# Patient Record
Sex: Female | Born: 1970 | Race: White | Hispanic: No | Marital: Married | Smoking: Never smoker
Health system: Southern US, Community
[De-identification: ages and names within clinical notes are randomized; demographics above are authoritative.]

## PROBLEM LIST (undated history)

## (undated) DIAGNOSIS — F32A Depression, unspecified: Secondary | ICD-10-CM

## (undated) DIAGNOSIS — J189 Pneumonia, unspecified organism: Secondary | ICD-10-CM

## (undated) DIAGNOSIS — F329 Major depressive disorder, single episode, unspecified: Secondary | ICD-10-CM

## (undated) DIAGNOSIS — I10 Essential (primary) hypertension: Secondary | ICD-10-CM

---

## 1999-10-12 ENCOUNTER — Other Ambulatory Visit: Admission: RE | Admit: 1999-10-12 | Discharge: 1999-10-12 | Payer: Self-pay | Admitting: *Deleted

## 2000-06-12 ENCOUNTER — Emergency Department (HOSPITAL_COMMUNITY): Admission: EM | Admit: 2000-06-12 | Discharge: 2000-06-12 | Payer: Self-pay | Admitting: Emergency Medicine

## 2001-01-19 ENCOUNTER — Other Ambulatory Visit: Admission: RE | Admit: 2001-01-19 | Discharge: 2001-01-19 | Payer: Self-pay | Admitting: *Deleted

## 2002-01-16 ENCOUNTER — Emergency Department (HOSPITAL_COMMUNITY): Admission: EM | Admit: 2002-01-16 | Discharge: 2002-01-17 | Payer: Self-pay | Admitting: Emergency Medicine

## 2002-01-17 ENCOUNTER — Encounter: Payer: Self-pay | Admitting: Emergency Medicine

## 2002-03-12 ENCOUNTER — Other Ambulatory Visit: Admission: RE | Admit: 2002-03-12 | Discharge: 2002-03-12 | Payer: Self-pay | Admitting: Obstetrics and Gynecology

## 2003-03-15 ENCOUNTER — Other Ambulatory Visit: Admission: RE | Admit: 2003-03-15 | Discharge: 2003-03-15 | Payer: Self-pay | Admitting: Obstetrics and Gynecology

## 2003-06-17 ENCOUNTER — Encounter: Payer: Self-pay | Admitting: Internal Medicine

## 2003-06-17 ENCOUNTER — Encounter: Admission: RE | Admit: 2003-06-17 | Discharge: 2003-06-17 | Payer: Self-pay | Admitting: Internal Medicine

## 2004-12-25 ENCOUNTER — Encounter: Admission: RE | Admit: 2004-12-25 | Discharge: 2004-12-25 | Payer: Self-pay | Admitting: Internal Medicine

## 2007-02-17 ENCOUNTER — Encounter: Admission: RE | Admit: 2007-02-17 | Discharge: 2007-02-17 | Payer: Self-pay | Admitting: Internal Medicine

## 2008-06-16 ENCOUNTER — Encounter: Admission: RE | Admit: 2008-06-16 | Discharge: 2008-06-16 | Payer: Self-pay | Admitting: Internal Medicine

## 2008-07-18 ENCOUNTER — Encounter: Admission: RE | Admit: 2008-07-18 | Discharge: 2008-07-18 | Payer: Self-pay | Admitting: Internal Medicine

## 2008-12-12 ENCOUNTER — Encounter: Admission: RE | Admit: 2008-12-12 | Discharge: 2008-12-12 | Payer: Self-pay | Admitting: Internal Medicine

## 2008-12-27 ENCOUNTER — Encounter: Admission: RE | Admit: 2008-12-27 | Discharge: 2008-12-27 | Payer: Self-pay | Admitting: Internal Medicine

## 2009-09-23 DIAGNOSIS — J189 Pneumonia, unspecified organism: Secondary | ICD-10-CM

## 2009-09-23 HISTORY — DX: Pneumonia, unspecified organism: J18.9

## 2010-05-16 ENCOUNTER — Encounter: Admission: RE | Admit: 2010-05-16 | Discharge: 2010-05-16 | Payer: Self-pay | Admitting: Internal Medicine

## 2010-08-21 ENCOUNTER — Encounter: Admission: RE | Admit: 2010-08-21 | Discharge: 2010-08-21 | Payer: Self-pay | Admitting: Interventional Radiology

## 2010-09-25 ENCOUNTER — Encounter
Admission: RE | Admit: 2010-09-25 | Discharge: 2010-09-25 | Payer: Self-pay | Source: Home / Self Care | Attending: Interventional Radiology | Admitting: Interventional Radiology

## 2010-10-02 ENCOUNTER — Encounter
Admission: RE | Admit: 2010-10-02 | Discharge: 2010-10-02 | Payer: Self-pay | Source: Home / Self Care | Attending: Interventional Radiology | Admitting: Interventional Radiology

## 2010-10-13 ENCOUNTER — Encounter: Payer: Self-pay | Admitting: Internal Medicine

## 2010-10-13 ENCOUNTER — Other Ambulatory Visit: Payer: Self-pay | Admitting: Interventional Radiology

## 2010-10-13 DIAGNOSIS — I8393 Asymptomatic varicose veins of bilateral lower extremities: Secondary | ICD-10-CM

## 2010-10-13 DIAGNOSIS — I8390 Asymptomatic varicose veins of unspecified lower extremity: Secondary | ICD-10-CM

## 2010-10-14 ENCOUNTER — Encounter: Payer: Self-pay | Admitting: Emergency Medicine

## 2010-10-14 ENCOUNTER — Encounter: Payer: Self-pay | Admitting: Internal Medicine

## 2010-10-15 ENCOUNTER — Other Ambulatory Visit: Payer: Self-pay | Admitting: Interventional Radiology

## 2010-10-15 DIAGNOSIS — I8390 Asymptomatic varicose veins of unspecified lower extremity: Secondary | ICD-10-CM

## 2010-10-24 ENCOUNTER — Other Ambulatory Visit: Payer: Self-pay | Admitting: Interventional Radiology

## 2010-10-24 ENCOUNTER — Ambulatory Visit: Payer: Self-pay

## 2010-10-24 ENCOUNTER — Ambulatory Visit
Admission: RE | Admit: 2010-10-24 | Discharge: 2010-10-24 | Disposition: A | Discharge status: Home / Self Care | Payer: Self-pay | Source: Ambulatory Visit | Attending: Interventional Radiology | Admitting: Interventional Radiology

## 2010-10-24 DIAGNOSIS — I8390 Asymptomatic varicose veins of unspecified lower extremity: Secondary | ICD-10-CM

## 2010-10-24 DIAGNOSIS — I8393 Asymptomatic varicose veins of bilateral lower extremities: Secondary | ICD-10-CM

## 2010-11-02 ENCOUNTER — Other Ambulatory Visit: Payer: Self-pay | Admitting: Interventional Radiology

## 2010-11-02 DIAGNOSIS — I8393 Asymptomatic varicose veins of bilateral lower extremities: Secondary | ICD-10-CM

## 2010-11-07 ENCOUNTER — Other Ambulatory Visit: Payer: Self-pay

## 2010-11-07 ENCOUNTER — Ambulatory Visit: Payer: Self-pay

## 2010-11-22 ENCOUNTER — Other Ambulatory Visit: Payer: Self-pay | Admitting: Interventional Radiology

## 2010-11-22 DIAGNOSIS — I83812 Varicose veins of left lower extremities with pain: Secondary | ICD-10-CM

## 2010-11-28 ENCOUNTER — Ambulatory Visit
Admission: RE | Admit: 2010-11-28 | Discharge: 2010-11-28 | Disposition: A | Discharge status: Home / Self Care | Payer: PRIVATE HEALTH INSURANCE | Source: Ambulatory Visit | Attending: Interventional Radiology | Admitting: Interventional Radiology

## 2010-11-28 DIAGNOSIS — I8393 Asymptomatic varicose veins of bilateral lower extremities: Secondary | ICD-10-CM

## 2010-11-28 MED ORDER — DIAZEPAM 10 MG PO TABS: 10.0000 mg | ORAL_TABLET | Freq: Once | ORAL | Status: DC

## 2010-11-28 MED ORDER — DIPHENHYDRAMINE HCL 50 MG PO CAPS: 50.0000 mg | ORAL_CAPSULE | Freq: Once | ORAL | Status: DC

## 2010-11-28 NOTE — Progress Notes (Signed)
Informed consent obtained. Given verbal & written D/C instructions & states that she understands.    1400:  ? Vasovagal reaction.  Pt c/o feeling hot, diaphoretic, color red.  Anxious, but responds appropriately.                VS:  140/84: P, 100, R, 22, 02 sat 100%.  No evidence of urticaria or edema.  Given Diphenhydramine 50 mg    po per Dr Miles Costain.    1410 Sx resolving.  Skin warm & dry.  Color red.  Breathing easy.  More comfortable.    1500  Procedure completed.  Pt wearing thigh high graduated compression garment on Left leg (20-30 mm Hg).             IV D/C/d, catheter intact, site unremarkable.  Pt comfortable, appropriate responses.    1510-1520  Ambulated x 10 mins.  Gait steady.    1530   Has driver available Asencion Partridge).  Reviewed D/C instructions w/ pt.  Pt D/C'd to home.

## 2010-11-29 ENCOUNTER — Other Ambulatory Visit: Payer: Self-pay

## 2010-11-29 ENCOUNTER — Ambulatory Visit: Payer: Self-pay

## 2010-12-05 ENCOUNTER — Ambulatory Visit
Admission: RE | Admit: 2010-12-05 | Discharge: 2010-12-05 | Disposition: A | Discharge status: Home / Self Care | Payer: PRIVATE HEALTH INSURANCE | Source: Ambulatory Visit | Attending: Interventional Radiology | Admitting: Interventional Radiology

## 2010-12-05 DIAGNOSIS — I83812 Varicose veins of left lower extremities with pain: Secondary | ICD-10-CM

## 2011-01-01 ENCOUNTER — Other Ambulatory Visit: Payer: Self-pay

## 2011-01-01 ENCOUNTER — Ambulatory Visit: Payer: Self-pay

## 2011-01-04 ENCOUNTER — Other Ambulatory Visit: Payer: Self-pay | Admitting: *Deleted

## 2011-01-04 ENCOUNTER — Ambulatory Visit
Admission: RE | Admit: 2011-01-04 | Discharge: 2011-01-04 | Disposition: A | Discharge status: Home / Self Care | Payer: PRIVATE HEALTH INSURANCE | Source: Ambulatory Visit | Attending: *Deleted | Admitting: *Deleted

## 2011-01-04 DIAGNOSIS — M549 Dorsalgia, unspecified: Secondary | ICD-10-CM

## 2011-01-09 ENCOUNTER — Ambulatory Visit
Admission: RE | Admit: 2011-01-09 | Discharge: 2011-01-09 | Disposition: A | Discharge status: Home / Self Care | Payer: PRIVATE HEALTH INSURANCE | Source: Ambulatory Visit | Attending: Interventional Radiology | Admitting: Interventional Radiology

## 2011-01-09 DIAGNOSIS — I83812 Varicose veins of left lower extremities with pain: Secondary | ICD-10-CM

## 2011-01-28 ENCOUNTER — Other Ambulatory Visit: Payer: Self-pay | Admitting: *Deleted

## 2011-01-28 ENCOUNTER — Ambulatory Visit
Admission: RE | Admit: 2011-01-28 | Discharge: 2011-01-28 | Disposition: A | Discharge status: Home / Self Care | Payer: PRIVATE HEALTH INSURANCE | Source: Ambulatory Visit | Attending: *Deleted | Admitting: *Deleted

## 2011-01-28 DIAGNOSIS — R05 Cough: Secondary | ICD-10-CM

## 2011-07-22 ENCOUNTER — Other Ambulatory Visit: Payer: Self-pay | Admitting: Interventional Radiology

## 2011-07-22 DIAGNOSIS — I83813 Varicose veins of bilateral lower extremities with pain: Secondary | ICD-10-CM

## 2011-07-23 ENCOUNTER — Ambulatory Visit
Admission: RE | Admit: 2011-07-23 | Discharge: 2011-07-23 | Disposition: A | Discharge status: Home / Self Care | Payer: PRIVATE HEALTH INSURANCE | Source: Ambulatory Visit | Attending: Interventional Radiology | Admitting: Interventional Radiology

## 2011-07-23 DIAGNOSIS — I83813 Varicose veins of bilateral lower extremities with pain: Secondary | ICD-10-CM

## 2012-01-12 ENCOUNTER — Emergency Department (HOSPITAL_COMMUNITY): Payer: PRIVATE HEALTH INSURANCE

## 2012-01-12 ENCOUNTER — Encounter (HOSPITAL_COMMUNITY): Payer: Self-pay | Admitting: Emergency Medicine

## 2012-01-12 ENCOUNTER — Ambulatory Visit (HOSPITAL_COMMUNITY)
Admission: EM | Admit: 2012-01-12 | Discharge: 2012-01-14 | Disposition: A | Discharge status: Home / Self Care | Payer: PRIVATE HEALTH INSURANCE | Attending: Surgery | Admitting: Surgery

## 2012-01-12 DIAGNOSIS — I10 Essential (primary) hypertension: Secondary | ICD-10-CM | POA: Insufficient documentation

## 2012-01-12 DIAGNOSIS — K8050 Calculus of bile duct without cholangitis or cholecystitis without obstruction: Secondary | ICD-10-CM

## 2012-01-12 DIAGNOSIS — K8010 Calculus of gallbladder with chronic cholecystitis without obstruction: Secondary | ICD-10-CM | POA: Insufficient documentation

## 2012-01-12 DIAGNOSIS — K7689 Other specified diseases of liver: Secondary | ICD-10-CM | POA: Insufficient documentation

## 2012-01-12 HISTORY — DX: Essential (primary) hypertension: I10

## 2012-01-12 HISTORY — DX: Depression, unspecified: F32.A

## 2012-01-12 HISTORY — DX: Major depressive disorder, single episode, unspecified: F32.9

## 2012-01-12 HISTORY — DX: Pneumonia, unspecified organism: J18.9

## 2012-01-12 LAB — CBC
HCT: 40.1 % (ref 36.0–46.0)
MCH: 32.4 pg (ref 26.0–34.0)
MCV: 93.5 fL (ref 78.0–100.0)
Platelets: 305 10*3/uL (ref 150–400)
RBC: 4.29 MIL/uL (ref 3.87–5.11)
RDW: 12.5 % (ref 11.5–15.5)

## 2012-01-12 LAB — COMPREHENSIVE METABOLIC PANEL
ALT: 28 U/L (ref 0–35)
Calcium: 9.9 mg/dL (ref 8.4–10.5)
Creatinine, Ser: 0.75 mg/dL (ref 0.50–1.10)
GFR calc Af Amer: >90 mL/min (ref >90)
GFR calc non Af Amer: >90 mL/min (ref >90)
Glucose, Bld: 142 mg/dL — ABNORMAL HIGH (ref 70–99)
Sodium: 138 mEq/L (ref 135–145)
Total Protein: 8 g/dL (ref 6.0–8.3)

## 2012-01-12 LAB — DIFFERENTIAL
Eosinophils Absolute: 0 10*3/uL (ref 0.0–0.7)
Eosinophils Relative: 0 % (ref 0–5)
Lymphs Abs: 2.2 10*3/uL (ref 0.7–4.0)
Monocytes Absolute: 0.5 10*3/uL (ref 0.1–1.0)

## 2012-01-12 LAB — LIPASE, BLOOD: Lipase: 41 U/L (ref 11–59)

## 2012-01-12 LAB — URINALYSIS, ROUTINE W REFLEX MICROSCOPIC
Bilirubin Urine: NEGATIVE
Glucose, UA: NEGATIVE mg/dL
Ketones, ur: 40 mg/dL — AB
pH: 6 (ref 5.0–8.0)

## 2012-01-12 MED ORDER — HYDROMORPHONE HCL PF 1 MG/ML IJ SOLN
1.0000 mg | Freq: Once | INTRAMUSCULAR | Status: AC
  Administered 2012-01-12: 1 mg via INTRAVENOUS
  Filled 2012-01-12: qty 1

## 2012-01-12 MED ORDER — HYDROMORPHONE HCL PF 1 MG/ML IJ SOLN
INTRAMUSCULAR | Status: AC
  Administered 2012-01-12: 1 mg
  Filled 2012-01-12: qty 1

## 2012-01-12 MED ORDER — ONDANSETRON HCL 4 MG/2ML IJ SOLN
4.0000 mg | Freq: Once | INTRAMUSCULAR | Status: AC
  Administered 2012-01-12: 4 mg via INTRAVENOUS
  Filled 2012-01-12: qty 2

## 2012-01-12 NOTE — ED Notes (Signed)
Pt presents with c/o abd pain in her RUQ that started after having supper  Pt states she had taco soup with cheese and burritos  Pt denies N/V  Pt states she tried to vomit but could not

## 2012-01-12 NOTE — ED Provider Notes (Signed)
History     CSN: [id number]  Arrival date & time 01/12/12  1953   First MD Initiated Contact with Patient 01/12/12 2126      Chief Complaint  Patient presents with  . Abdominal Pain    (Consider location/radiation/quality/duration/timing/severity/associated sxs/prior treatment) HPI Comments: Patient here with acute onset of RUQ abdominal pain that started after eating a high fat meal, states that this was associated with nausea but has not been able to vomit - states she tried to make herself vomit but could not - denies fever, chills, vomiting, constipation, diarrhea, reports no prior episode like this in the past - no prior abdominal surgeries.  Patient is a 41 y.o. female presenting with abdominal pain. The history is provided by the patient. No language interpreter was used.  Abdominal Pain The primary symptoms of the illness include abdominal pain, nausea and vomiting. The primary symptoms of the illness do not include fever, fatigue, shortness of breath, diarrhea, hematemesis, hematochezia, dysuria, vaginal discharge or vaginal bleeding. The current episode started 1 to 2 hours ago. The onset of the illness was sudden. The problem has not changed since onset. The illness is associated with eating. The patient states that she believes she is currently not pregnant. The patient has not had a change in bowel habit. Symptoms associated with the illness do not include chills, anorexia, diaphoresis, heartburn, constipation, urgency, hematuria, frequency or back pain.    Past Medical History  Diagnosis Date  . Hypertension     History reviewed. No pertinent past surgical history.  Family History  Problem Relation Age of Onset  . Coronary artery disease Other     History  Substance Use Topics  . Smoking status: Never Smoker   . Smokeless tobacco: Not on file  . Alcohol Use: Yes     occassional    OB History    Grav Para Term Preterm Abortions TAB SAB Ect Mult Living            Review of Systems  Constitutional: Negative for fever, chills, diaphoresis and fatigue.  Respiratory: Negative for shortness of breath.   Gastrointestinal: Positive for nausea, vomiting and abdominal pain. Negative for heartburn, diarrhea, constipation, hematochezia, anorexia and hematemesis.  Genitourinary: Negative for dysuria, urgency, frequency, hematuria, vaginal bleeding and vaginal discharge.  Musculoskeletal: Negative for back pain.  All other systems reviewed and are negative.    Allergies  Review of patient's allergies indicates no known allergies.  Home Medications   Current Outpatient Rx  Name Route Sig Dispense Refill  . AMLODIPINE BESYLATE 5 MG PO TABS Oral Take 5 mg by mouth daily.    Marland Kitchen FLUOXETINE HCL 20 MG PO CAPS Oral Take 20 mg by mouth daily.    Marland Kitchen HYDROCHLOROTHIAZIDE 25 MG PO TABS Oral Take 25 mg by mouth daily.    . ADULT MULTIVITAMIN W/MINERALS CH Oral Take 1 tablet by mouth daily.    Azzie Roup ACE-ETH ESTRAD-FE 1-20 MG-MCG PO TABS Oral Take 1 tablet by mouth daily.    Marland Kitchen OVER THE COUNTER MEDICATION Oral Take 1 tablet by mouth. 360 weight loss      BP 147/80  Pulse 87  Temp(Src) 97.7 F (36.5 C) (Oral)  Resp 20  SpO2 100%  LMP 01/06/2012  Physical Exam  Nursing note and vitals reviewed. Constitutional: She is oriented to person, place, and time. She appears well-developed and well-nourished. She appears distressed.  HENT:  Head: Normocephalic and atraumatic.  Right Ear: External ear normal.  Left Ear:  External ear normal.  Nose: Nose normal.  Mouth/Throat: Oropharynx is clear and moist. No oropharyngeal exudate.  Eyes: Conjunctivae are normal. Pupils are equal, round, and reactive to light. No scleral icterus.  Neck: Normal range of motion. Neck supple.  Cardiovascular: Normal rate, regular rhythm and normal heart sounds.  Exam reveals no gallop and no friction rub.   No murmur heard. Pulmonary/Chest: Effort normal and breath sounds  normal. No respiratory distress. She has no wheezes. She has no rales. She exhibits no tenderness.  Abdominal: Soft. Bowel sounds are normal. She exhibits no distension. There is tenderness in the right upper quadrant. There is guarding and positive Murphy's sign. There is no rigidity, no rebound, no CVA tenderness and no tenderness at McBurney's point.    Musculoskeletal: Normal range of motion. She exhibits no edema and no tenderness.  Lymphadenopathy:    She has no cervical adenopathy.  Neurological: She is alert and oriented to person, place, and time. No cranial nerve deficit.  Skin: Skin is warm and dry. No rash noted. No erythema. No pallor.  Psychiatric: She has a normal mood and affect. Her behavior is normal. Judgment and thought content normal.    ED Course  Procedures (including critical care time)  Labs Reviewed  COMPREHENSIVE METABOLIC PANEL - Abnormal; Notable for the following:    Potassium 3.3 (*)    Glucose, Bld 142 (*)    All other components within normal limits  URINALYSIS, ROUTINE W REFLEX MICROSCOPIC - Abnormal; Notable for the following:    Ketones, ur 40 (*)    All other components within normal limits  CBC  DIFFERENTIAL  LIPASE, BLOOD  PREGNANCY, URINE   US Abdomen Complete  01/12/2012  *RADIOLOGY REPORT*  Clinical Data:  Abdominal pain for several hours.  COMPLETE ABDOMINAL ULTRASOUND  Comparison:  CT 02/17/2007  Findings:  Gallbladder:  Multiple stones in the dependent portion of the gallbladder.  One stone appears to be fixed in the gallbladder neck.  No significant gallbladder distension, wall thickening, or pericholecystic edema.  Murphy's sign is positive.  Changes are nonspecific but could be associated with acute cholecystitis in the appropriate clinical setting.  Common bile duct:  Normal caliber, measured at 4 mm diameter.  Liver:  Diffusely increased hepatic parenchymal echotexture suggesting fatty infiltration.  No focal lesions demonstrated.  IVC:   Appears normal.  Pancreas:  The pancreas is mostly obscured by overlying bowel gas. Visualized portions of the head and tail are unremarkable.  Spleen:  Spleen length measures 10 cm.  Normal homogeneous parenchymal echotexture.  Right Kidney:  Right kidney measures 11.6 cm length.  No hydronephrosis.  Left Kidney:  Left kidney measures 12 cm length.  No hydronephrosis.  Abdominal aorta:  Normal caliber.  No aneurysm.  IMPRESSION: Cholelithiasis with stone in the gallbladder neck.  No wall thickening or pericholecystic edema, but Murphy's sign is positive. Changes are nonspecific but could be consistent with acute cholecystitis in the appropriate clinical setting.  Diffuse fatty infiltration of the liver.  Original Report Authenticated By: Marlon Pel, M.D.   Results for orders placed during the hospital encounter of 01/12/12  CBC      Component Value Range   WBC 9.7  4.0 - 10.5 (K/uL)   RBC 4.29  3.87 - 5.11 (MIL/uL)   Hemoglobin 13.9  12.0 - 15.0 (g/dL)   HCT 96.0  45.4 - 09.8 (%)   MCV 93.5  78.0 - 100.0 (fL)   MCH 32.4  26.0 - 34.0 (  pg)   MCHC 34.7  30.0 - 36.0 (g/dL)   RDW 16.1  09.6 - 04.5 (%)   Platelets 305  150 - 400 (K/uL)  DIFFERENTIAL      Component Value Range   Neutrophils Relative 72  43 - 77 (%)   Neutro Abs 6.9  1.7 - 7.7 (K/uL)   Lymphocytes Relative 23  12 - 46 (%)   Lymphs Abs 2.2  0.7 - 4.0 (K/uL)   Monocytes Relative 5  3 - 12 (%)   Monocytes Absolute 0.5  0.1 - 1.0 (K/uL)   Eosinophils Relative 0  0 - 5 (%)   Eosinophils Absolute 0.0  0.0 - 0.7 (K/uL)   Basophils Relative 0  0 - 1 (%)   Basophils Absolute 0.0  0.0 - 0.1 (K/uL)  COMPREHENSIVE METABOLIC PANEL      Component Value Range   Sodium 138  135 - 145 (mEq/L)   Potassium 3.3 (*) 3.5 - 5.1 (mEq/L)   Chloride 101  96 - 112 (mEq/L)   CO2 23  19 - 32 (mEq/L)   Glucose, Bld 142 (*) 70 - 99 (mg/dL)   BUN 9  6 - 23 (mg/dL)   Creatinine, Ser 4.09  0.50 - 1.10 (mg/dL)   Calcium 9.9  8.4 - 81.1 (mg/dL)    Total Protein 8.0  6.0 - 8.3 (g/dL)   Albumin 4.2  3.5 - 5.2 (g/dL)   AST 18  0 - 37 (U/L)   ALT 28  0 - 35 (U/L)   Alkaline Phosphatase 62  39 - 117 (U/L)   Total Bilirubin 0.3  0.3 - 1.2 (mg/dL)   GFR calc non Af Amer >90  >90 (mL/min)   GFR calc Af Amer >90  >90 (mL/min)  LIPASE, BLOOD      Component Value Range   Lipase 41  11 - 59 (U/L)  URINALYSIS, ROUTINE W REFLEX MICROSCOPIC      Component Value Range   Color, Urine YELLOW  YELLOW    APPearance CLEAR  CLEAR    Specific Gravity, Urine 1.016  1.005 - 1.030    pH 6.0  5.0 - 8.0    Glucose, UA NEGATIVE  NEGATIVE (mg/dL)   Hgb urine dipstick NEGATIVE  NEGATIVE    Bilirubin Urine NEGATIVE  NEGATIVE    Ketones, ur 40 (*) NEGATIVE (mg/dL)   Protein, ur NEGATIVE  NEGATIVE (mg/dL)   Urobilinogen, UA 0.2  0.0 - 1.0 (mg/dL)   Nitrite NEGATIVE  NEGATIVE    Leukocytes, UA NEGATIVE  NEGATIVE   PREGNANCY, URINE      Component Value Range   Preg Test, Ur NEGATIVE  NEGATIVE    US Abdomen Complete  01/12/2012  *RADIOLOGY REPORT*  Clinical Data:  Abdominal pain for several hours.  COMPLETE ABDOMINAL ULTRASOUND  Comparison:  CT 02/17/2007  Findings:  Gallbladder:  Multiple stones in the dependent portion of the gallbladder.  One stone appears to be fixed in the gallbladder neck.  No significant gallbladder distension, wall thickening, or pericholecystic edema.  Murphy's sign is positive.  Changes are nonspecific but could be associated with acute cholecystitis in the appropriate clinical setting.  Common bile duct:  Normal caliber, measured at 4 mm diameter.  Liver:  Diffusely increased hepatic parenchymal echotexture suggesting fatty infiltration.  No focal lesions demonstrated.  IVC:  Appears normal.  Pancreas:  The pancreas is mostly obscured by overlying bowel gas. Visualized portions of the head and tail are unremarkable.  Spleen:  Spleen  length measures 10 cm.  Normal homogeneous parenchymal echotexture.  Right Kidney:  Right kidney measures  11.6 cm length.  No hydronephrosis.  Left Kidney:  Left kidney measures 12 cm length.  No hydronephrosis.  Abdominal aorta:  Normal caliber.  No aneurysm.  IMPRESSION: Cholelithiasis with stone in the gallbladder neck.  No wall thickening or pericholecystic edema, but Murphy's sign is positive. Changes are nonspecific but could be consistent with acute cholecystitis in the appropriate clinical setting.  Diffuse fatty infiltration of the liver.  Original Report Authenticated By: Marlon Pel, M.D.     Cholelithiasis   MDM  Patient here with acute onset of pain - [place] with noted cholelithiasis with one stone in the neck of the GB, patient currently still in pain with severe pain and nausea.  Though there are no signs of cholecystitis or CBD stone, we have called Dr. Biagio Quint with surgery who will come see the patient.       Izola Price [place], [place] 01/12/12 2346

## 2012-01-12 NOTE — ED Notes (Signed)
Pt presented to the ER with c/o right upper quadrant pain, acute onset,started about 1hr ago right after dinner. Pt states she made her self vomit, no relief from that.

## 2012-01-13 ENCOUNTER — Inpatient Hospital Stay (HOSPITAL_COMMUNITY): Payer: PRIVATE HEALTH INSURANCE | Admitting: Anesthesiology

## 2012-01-13 ENCOUNTER — Inpatient Hospital Stay (HOSPITAL_COMMUNITY): Payer: PRIVATE HEALTH INSURANCE

## 2012-01-13 ENCOUNTER — Encounter (HOSPITAL_COMMUNITY): Payer: Self-pay | Admitting: Anesthesiology

## 2012-01-13 ENCOUNTER — Encounter (HOSPITAL_COMMUNITY)
Admission: EM | Disposition: A | Discharge status: Home / Self Care | Payer: Self-pay | Source: Home / Self Care | Attending: Emergency Medicine

## 2012-01-13 ENCOUNTER — Encounter (HOSPITAL_COMMUNITY): Payer: Self-pay | Admitting: *Deleted

## 2012-01-13 DIAGNOSIS — K8010 Calculus of gallbladder with chronic cholecystitis without obstruction: Secondary | ICD-10-CM

## 2012-01-13 DIAGNOSIS — K824 Cholesterolosis of gallbladder: Secondary | ICD-10-CM

## 2012-01-13 HISTORY — PX: CHOLECYSTECTOMY: SHX55

## 2012-01-13 SURGERY — LAPAROSCOPIC CHOLECYSTECTOMY WITH INTRAOPERATIVE CHOLANGIOGRAM
Anesthesia: General | Site: Abdomen | Wound class: Clean Contaminated

## 2012-01-13 MED ORDER — MORPHINE SULFATE 2 MG/ML IJ SOLN
1.0000 mg | INTRAMUSCULAR | Status: DC | PRN
  Administered 2012-01-13 (×2): 4 mg via INTRAVENOUS
  Filled 2012-01-13 (×2): qty 2

## 2012-01-13 MED ORDER — FLUOXETINE HCL 20 MG PO CAPS
20.0000 mg | ORAL_CAPSULE | Freq: Every day | ORAL | Status: DC
  Filled 2012-01-13: qty 1

## 2012-01-13 MED ORDER — SODIUM CHLORIDE 0.9 % IV SOLN
3.0000 g | Freq: Four times a day (QID) | INTRAVENOUS | Status: DC
  Administered 2012-01-13: 3 g via INTRAVENOUS
  Filled 2012-01-13 (×6): qty 3

## 2012-01-13 MED ORDER — AMLODIPINE BESYLATE 5 MG PO TABS
5.0000 mg | ORAL_TABLET | Freq: Every day | ORAL | Status: DC
  Filled 2012-01-13: qty 1

## 2012-01-13 MED ORDER — SODIUM CHLORIDE 0.9 % IR SOLN
Status: DC | PRN
  Administered 2012-01-13: 1000 mL

## 2012-01-13 MED ORDER — IOHEXOL 300 MG/ML  SOLN
INTRAMUSCULAR | Status: DC | PRN
  Administered 2012-01-13: 4 mL via INTRAVENOUS

## 2012-01-13 MED ORDER — GLYCOPYRROLATE 0.2 MG/ML IJ SOLN
INTRAMUSCULAR | Status: DC | PRN
  Administered 2012-01-13: .5 mg via INTRAVENOUS

## 2012-01-13 MED ORDER — ACETAMINOPHEN 10 MG/ML IV SOLN
1000.0000 mg | Freq: Four times a day (QID) | INTRAVENOUS | Status: DC
  Administered 2012-01-13: 1000 mg via INTRAVENOUS
  Filled 2012-01-13: qty 100

## 2012-01-13 MED ORDER — HYDROMORPHONE HCL PF 1 MG/ML IJ SOLN
0.2500 mg | INTRAMUSCULAR | Status: DC | PRN
  Administered 2012-01-13 (×5): 0.5 mg via INTRAVENOUS

## 2012-01-13 MED ORDER — LACTATED RINGERS IV SOLN
INTRAVENOUS | Status: DC | PRN
  Administered 2012-01-13 (×2): via INTRAVENOUS

## 2012-01-13 MED ORDER — ONDANSETRON HCL 4 MG/2ML IJ SOLN: 4.0000 mg | Freq: Four times a day (QID) | INTRAMUSCULAR | Status: DC | PRN

## 2012-01-13 MED ORDER — ONDANSETRON HCL 4 MG/2ML IJ SOLN
INTRAMUSCULAR | Status: DC | PRN
  Administered 2012-01-13: 4 mg via INTRAVENOUS

## 2012-01-13 MED ORDER — HYDROMORPHONE HCL PF 1 MG/ML IJ SOLN: 0.2500 mg | INTRAMUSCULAR | Status: DC | PRN

## 2012-01-13 MED ORDER — KCL IN DEXTROSE-NACL 20-5-0.45 MEQ/L-%-% IV SOLN
INTRAVENOUS | Status: DC
  Administered 2012-01-13: 05:00:00 via INTRAVENOUS
  Filled 2012-01-13 (×4): qty 1000

## 2012-01-13 MED ORDER — NEOSTIGMINE METHYLSULFATE 1 MG/ML IJ SOLN
INTRAMUSCULAR | Status: DC | PRN
  Administered 2012-01-13: 4 mg via INTRAVENOUS

## 2012-01-13 MED ORDER — ROCURONIUM BROMIDE 100 MG/10ML IV SOLN
INTRAVENOUS | Status: DC | PRN
  Administered 2012-01-13: 50 mg via INTRAVENOUS

## 2012-01-13 MED ORDER — HYDROMORPHONE HCL PF 1 MG/ML IJ SOLN
INTRAMUSCULAR | Status: AC
  Filled 2012-01-13: qty 1

## 2012-01-13 MED ORDER — MIDAZOLAM HCL 5 MG/5ML IJ SOLN
INTRAMUSCULAR | Status: DC | PRN
  Administered 2012-01-13: 2 mg via INTRAVENOUS

## 2012-01-13 MED ORDER — LIDOCAINE HCL 1 % IJ SOLN
INTRAMUSCULAR | Status: AC
  Filled 2012-01-13: qty 40

## 2012-01-13 MED ORDER — BUPIVACAINE-EPINEPHRINE 0.25% -1:200000 IJ SOLN
INTRAMUSCULAR | Status: DC | PRN
  Administered 2012-01-13: 20 mL

## 2012-01-13 MED ORDER — KETOROLAC TROMETHAMINE 30 MG/ML IJ SOLN
30.0000 mg | Freq: Three times a day (TID) | INTRAMUSCULAR | Status: DC | PRN
  Administered 2012-01-13 – 2012-01-14 (×2): 30 mg via INTRAVENOUS
  Filled 2012-01-13 (×2): qty 1

## 2012-01-13 MED ORDER — LIDOCAINE HCL 1 % IJ SOLN
INTRAMUSCULAR | Status: DC | PRN
  Administered 2012-01-13: 20 mL

## 2012-01-13 MED ORDER — HYDROCHLOROTHIAZIDE 25 MG PO TABS
25.0000 mg | ORAL_TABLET | Freq: Every day | ORAL | Status: DC
  Filled 2012-01-13: qty 1

## 2012-01-13 MED ORDER — BUPIVACAINE-EPINEPHRINE 0.25% -1:200000 IJ SOLN
INTRAMUSCULAR | Status: AC
  Filled 2012-01-13: qty 1

## 2012-01-13 MED ORDER — PROPOFOL 10 MG/ML IV EMUL
INTRAVENOUS | Status: DC | PRN
  Administered 2012-01-13: 200 mg via INTRAVENOUS

## 2012-01-13 MED ORDER — ONDANSETRON HCL 4 MG PO TABS: 4.0000 mg | ORAL_TABLET | Freq: Four times a day (QID) | ORAL | Status: DC | PRN

## 2012-01-13 MED ORDER — SODIUM CHLORIDE 0.9 % IV SOLN
3.0000 g | Freq: Four times a day (QID) | INTRAVENOUS | Status: AC
  Administered 2012-01-13: 3 g via INTRAVENOUS
  Filled 2012-01-13: qty 3

## 2012-01-13 MED ORDER — PROMETHAZINE HCL 25 MG/ML IJ SOLN: 6.2500 mg | INTRAMUSCULAR | Status: DC | PRN

## 2012-01-13 MED ORDER — FENTANYL CITRATE 0.05 MG/ML IJ SOLN
INTRAMUSCULAR | Status: DC | PRN
  Administered 2012-01-13 (×2): 100 ug via INTRAVENOUS
  Administered 2012-01-13: 50 ug via INTRAVENOUS

## 2012-01-13 MED ORDER — LIDOCAINE HCL (CARDIAC) 20 MG/ML IV SOLN
INTRAVENOUS | Status: DC | PRN
  Administered 2012-01-13: 80 mg via INTRAVENOUS

## 2012-01-13 MED ORDER — LACTATED RINGERS IR SOLN
Status: DC | PRN
  Administered 2012-01-13: 1000 mL

## 2012-01-13 MED ORDER — KCL IN DEXTROSE-NACL 20-5-0.45 MEQ/L-%-% IV SOLN
INTRAVENOUS | Status: DC
  Administered 2012-01-13 – 2012-01-14 (×2): via INTRAVENOUS
  Filled 2012-01-13 (×4): qty 1000

## 2012-01-13 MED ORDER — ADULT MULTIVITAMIN W/MINERALS CH
1.0000 | ORAL_TABLET | Freq: Every day | ORAL | Status: DC
  Filled 2012-01-13: qty 1

## 2012-01-13 MED ORDER — ENOXAPARIN SODIUM 40 MG/0.4ML ~~LOC~~ SOLN
40.0000 mg | SUBCUTANEOUS | Status: DC
  Administered 2012-01-14: 40 mg via SUBCUTANEOUS
  Filled 2012-01-13: qty 0.4

## 2012-01-13 MED ORDER — MORPHINE SULFATE 2 MG/ML IJ SOLN: 1.0000 mg | INTRAMUSCULAR | Status: DC | PRN

## 2012-01-13 MED ORDER — HYDROCODONE-ACETAMINOPHEN 5-325 MG PO TABS
1.0000 | ORAL_TABLET | ORAL | Status: DC | PRN
  Administered 2012-01-13 – 2012-01-14 (×3): 2 via ORAL
  Filled 2012-01-13 (×3): qty 2

## 2012-01-13 MED ORDER — IOHEXOL 300 MG/ML  SOLN
INTRAMUSCULAR | Status: AC
  Filled 2012-01-13: qty 1

## 2012-01-13 MED ORDER — LIDOCAINE-EPINEPHRINE (PF) 1 %-1:200000 IJ SOLN
INTRAMUSCULAR | Status: AC
  Filled 2012-01-13: qty 10

## 2012-01-13 MED ORDER — NORETHIN ACE-ETH ESTRAD-FE 1-20 MG-MCG PO TABS: 1.0000 | ORAL_TABLET | Freq: Every day | ORAL | Status: DC

## 2012-01-13 MED ORDER — MORPHINE SULFATE 2 MG/ML IJ SOLN
2.0000 mg | INTRAMUSCULAR | Status: DC | PRN
  Administered 2012-01-13 (×2): 2 mg via INTRAVENOUS
  Filled 2012-01-13 (×2): qty 1

## 2012-01-13 SURGICAL SUPPLY — 42 items
ADH SKN CLS APL DERMABOND .7 (GAUZE/BANDAGES/DRESSINGS) ×1
APPLICATOR COTTON TIP 6IN STRL (MISCELLANEOUS) IMPLANT
APPLIER CLIP ROT 10 11.4 M/L (STAPLE) ×2
APR CLP MED LRG 11.4X10 (STAPLE) ×1
BAG SPEC RTRVL LRG 6X4 10 (ENDOMECHANICALS) ×1
CABLE HIGH FREQUENCY MONO STRZ (ELECTRODE) ×2 IMPLANT
CANISTER SUCTION 2500CC (MISCELLANEOUS) ×2 IMPLANT
CATH REDDICK CHOLANGI 4FR 50CM (CATHETERS) ×2 IMPLANT
CHLORAPREP W/TINT 26ML (MISCELLANEOUS) ×2 IMPLANT
CLIP APPLIE ROT 10 11.4 M/L (STAPLE) ×1 IMPLANT
CLOTH BEACON ORANGE TIMEOUT ST (SAFETY) ×2 IMPLANT
COVER SURGICAL LIGHT HANDLE (MISCELLANEOUS) ×2 IMPLANT
DECANTER SPIKE VIAL GLASS SM (MISCELLANEOUS) ×2 IMPLANT
DERMABOND ADVANCED (GAUZE/BANDAGES/DRESSINGS) ×1
DERMABOND ADVANCED .7 DNX12 (GAUZE/BANDAGES/DRESSINGS) IMPLANT
DRAPE C-ARM 42X72 X-RAY (DRAPES) ×2 IMPLANT
DRAPE LAPAROSCOPIC ABDOMINAL (DRAPES) ×2 IMPLANT
ELECT REM PT RETURN 9FT ADLT (ELECTROSURGICAL) ×2
ELECTRODE REM PT RTRN 9FT ADLT (ELECTROSURGICAL) ×1 IMPLANT
ENDOLOOP SUT PDS II  0 18 (SUTURE)
ENDOLOOP SUT PDS II 0 18 (SUTURE) IMPLANT
GLOVE SURG SS PI 7.5 STRL IVOR (GLOVE) ×4 IMPLANT
GOWN STRL NON-REIN LRG LVL3 (GOWN DISPOSABLE) ×2 IMPLANT
GOWN STRL REIN XL XLG (GOWN DISPOSABLE) ×4 IMPLANT
HEMOSTAT SURGICEL 4X8 (HEMOSTASIS) ×1 IMPLANT
IV CATH 14GX2 1/4 (CATHETERS) ×2 IMPLANT
KIT BASIN OR (CUSTOM PROCEDURE TRAY) ×2 IMPLANT
NS IRRIG 1000ML POUR BTL (IV SOLUTION) ×2 IMPLANT
PENCIL BUTTON HOLSTER BLD 10FT (ELECTRODE) ×2 IMPLANT
POUCH SPECIMEN RETRIEVAL 10MM (ENDOMECHANICALS) ×2 IMPLANT
SCISSORS LAP 5X35 DISP (ENDOMECHANICALS) ×2 IMPLANT
SET IRRIG TUBING LAPAROSCOPIC (IRRIGATION / IRRIGATOR) ×1 IMPLANT
SOLUTION ANTI FOG 6CC (MISCELLANEOUS) ×1 IMPLANT
STRIP CLOSURE SKIN 1/2X4 (GAUZE/BANDAGES/DRESSINGS) IMPLANT
SUT MNCRL AB 4-0 PS2 18 (SUTURE) ×2 IMPLANT
SUT VICRYL 0 UR6 27IN ABS (SUTURE) ×2 IMPLANT
TOWEL OR 17X26 10 PK STRL BLUE (TOWEL DISPOSABLE) ×2 IMPLANT
TRAY LAP CHOLE (CUSTOM PROCEDURE TRAY) ×2 IMPLANT
TROCAR BALLN 12MMX100 BLUNT (TROCAR) ×2 IMPLANT
TROCAR BLADELESS OPT 5 75 (ENDOMECHANICALS) ×4 IMPLANT
TROCAR XCEL NON-BLD 11X100MML (ENDOMECHANICALS) ×2 IMPLANT
TUBING INSUFFLATION 10FT LAP (TUBING) ×2 IMPLANT

## 2012-01-13 NOTE — Progress Notes (Signed)
Agree  Probably home tomorrow.  Wilmon Arms. Corliss Skains, MD, [facility]  01/13/2012 2:08 PM

## 2012-01-13 NOTE — Preoperative (Signed)
Beta Blockers   Reason not to administer Beta Blockers:Not Applicable 

## 2012-01-13 NOTE — Progress Notes (Signed)
Day of Surgery  Subjective: JUST  Back from PACU.  Hurting right now.    Objective: Vital signs in last 24 hours: Temp:  [97.6 F (36.4 C)-98.5 F (36.9 C)] 98.1 F (36.7 C) (04/22 0814) Pulse Rate:  [73-88] 77  (04/22 0831) Resp:  [16-22] 17  (04/22 0831) BP: (124-153)/(62-83) 146/81 mmHg (04/22 0831) SpO2:  [95 %-100 %] 100 % (04/22 0831) Weight:  [111.131 kg (245 lb)] 111.131 kg (245 lb) (04/22 0404) Last BM Date: 01/12/12 Afebrile, VSS, Intake/Output from previous day: 04/21 0701 - 04/22 0700 In: -  Out: 400 [Urine:400] Intake/Output this shift: Total I/O In: 1300 [I.V.:1300] Out: 50 [Blood:50]  General appearance: alert, cooperative and no distress GI: tender, having pain.  Lab Results:   [facility] 01/12/12 2100  WBC 9.7  HGB 13.9  HCT 40.1  PLT 305    BMET  Basename 01/12/12 2100  NA 138  K 3.3*  CL 101  CO2 23  GLUCOSE 142*  BUN 9  CREATININE 0.75  CALCIUM 9.9   PT/INR No results found for this basename: LABPROT:2,INR:2 in the last 72 hours   Lab 01/12/12 2100  AST 18  ALT 28  ALKPHOS 62  BILITOT 0.3  PROT 8.0  ALBUMIN 4.2     Lipase     Component Value Date/Time   LIPASE 41 01/12/2012 2100     Studies/Results: Dg Cholangiogram Operative  01/13/2012  *RADIOLOGY REPORT*  Clinical Data:   Cholelithiasis.  INTRAOPERATIVE CHOLANGIOGRAM  Technique:  Cholangiographic images from the C-arm fluoroscopic device were submitted for interpretation post-operatively.  Please see the procedural report for the amount of contrast and the fluoroscopy time utilized.  Comparison:  Abdominal ultrasound 01/12/2012  Findings:  Trunk angiogram demonstrates that the common hepatic and common bile duct are widely patent with free flow of contrast into the duodenum.  Incomplete filling of the intrahepatic ducts.  IMPRESSION: Normal intraoperative cholangiogram.  Original Report Authenticated By: Gwynn Burly, M.D.   US Abdomen Complete  01/12/2012  *RADIOLOGY  REPORT*  Clinical Data:  Abdominal pain for several hours.  COMPLETE ABDOMINAL ULTRASOUND  Comparison:  CT 02/17/2007  Findings:  Gallbladder:  Multiple stones in the dependent portion of the gallbladder.  One stone appears to be fixed in the gallbladder neck.  No significant gallbladder distension, wall thickening, or pericholecystic edema.  Murphy's sign is positive.  Changes are nonspecific but could be associated with acute cholecystitis in the appropriate clinical setting.  Common bile duct:  Normal caliber, measured at 4 mm diameter.  Liver:  Diffusely increased hepatic parenchymal echotexture suggesting fatty infiltration.  No focal lesions demonstrated.  IVC:  Appears normal.  Pancreas:  The pancreas is mostly obscured by overlying bowel gas. Visualized portions of the head and tail are unremarkable.  Spleen:  Spleen length measures 10 cm.  Normal homogeneous parenchymal echotexture.  Right Kidney:  Right kidney measures 11.6 cm length.  No hydronephrosis.  Left Kidney:  Left kidney measures 12 cm length.  No hydronephrosis.  Abdominal aorta:  Normal caliber.  No aneurysm.  IMPRESSION: Cholelithiasis with stone in the gallbladder neck.  No wall thickening or pericholecystic edema, but Murphy's sign is positive. Changes are nonspecific but could be consistent with acute cholecystitis in the appropriate clinical setting.  Diffuse fatty infiltration of the liver.  Original Report Authenticated By: Marlon Pel, M.D.    Medications:    . amLODipine  5 mg Oral Daily  . ampicillin-sulbactam (UNASYN) IV  3 g Intravenous  Q6H  . FLUoxetine  20 mg Oral Daily  . hydrochlorothiazide  25 mg Oral Daily  . HYDROmorphone      . HYDROmorphone      . HYDROmorphone      .  HYDROmorphone (DILAUDID) injection  1 mg Intravenous Once  .  HYDROmorphone (DILAUDID) injection  1 mg Intravenous Once  . mulitivitamin with minerals  1 tablet Oral Daily  . norethindrone-ethinyl estradiol  1 tablet Oral Daily  .  ondansetron (ZOFRAN) IV  4 mg Intravenous Once  . ondansetron (ZOFRAN) IV  4 mg Intravenous Once    Assessment/Plan Acute cholecystitis, cholelithiasis, s/p lap cholecystectomy 6AM 01/13/12 Dr. Biagio Quint Hypertension   Plan:  Analgesic, mobilize home when she can eat drink, walk and void. Most likely tomorrow.  LOS: 1 day    Sydney Woodward 01/13/2012

## 2012-01-13 NOTE — H&P (Signed)
Reason for Consult:abdominal pain and gallstones Referring Physician: [name] is an 41 y.o. female.  HPI: this patient presents to the emergency room for evaluation of acute onset of right upper quadrant pain beginning this evening at 6 PM approximately one hour after eating dinner. She denies any prior episodes of similar pain. She says that approximately one hour after E. She had acute onset of right upper quadrant pain which felt like someone "hit her in the stomach". She says it began in the epigastrium and radiated around to her back and has been persistent since it started. She thought that she ate too much dinner and she tried to make herself vomit but she got no relief from this. She has no other associated symptoms. She denies any fevers or chills. She states that her bowels are functioning. She denies any reflux symptoms. She denies any blood in the stools or melena.  Past Medical History  Diagnosis Date  . Hypertension     History reviewed. No pertinent past surgical history.  Family History  Problem Relation Age of Onset  . Coronary artery disease Other     Social History:  reports that she has never smoked. She does not have any smokeless tobacco history on file. She reports that she drinks alcohol. She reports that she does not use illicit drugs.  Allergies: No Known Allergies  All other review of systems negative or noncontributory except as stated in the HPI  Medications: I have reviewed the patient's current medications.  Results for orders placed during the hospital encounter of 01/12/12 (from the past 48 hour(s))  CBC     Status: Normal   Collection Time   01/12/12  9:00 PM      Component Value Range Comment   WBC 9.7  4.0 - 10.5 (K/uL)    RBC 4.29  3.87 - 5.11 (MIL/uL)    Hemoglobin 13.9  12.0 - 15.0 (g/dL)    HCT 21.3  08.6 - 57.8 (%)    MCV 93.5  78.0 - 100.0 (fL)    MCH 32.4  26.0 - 34.0 (pg)    MCHC 34.7  30.0 - 36.0 (g/dL)    RDW 46.9   62.9 - 52.8 (%)    Platelets 305  150 - 400 (K/uL)   DIFFERENTIAL     Status: Normal   Collection Time   01/12/12  9:00 PM      Component Value Range Comment   Neutrophils Relative 72  43 - 77 (%)    Neutro Abs 6.9  1.7 - 7.7 (K/uL)    Lymphocytes Relative 23  12 - 46 (%)    Lymphs Abs 2.2  0.7 - 4.0 (K/uL)    Monocytes Relative 5  3 - 12 (%)    Monocytes Absolute 0.5  0.1 - 1.0 (K/uL)    Eosinophils Relative 0  0 - 5 (%)    Eosinophils Absolute 0.0  0.0 - 0.7 (K/uL)    Basophils Relative 0  0 - 1 (%)    Basophils Absolute 0.0  0.0 - 0.1 (K/uL)   COMPREHENSIVE METABOLIC PANEL     Status: Abnormal   Collection Time   01/12/12  9:00 PM      Component Value Range Comment   Sodium 138  135 - 145 (mEq/L)    Potassium 3.3 (*) 3.5 - 5.1 (mEq/L)    Chloride 101  96 - 112 (mEq/L)    CO2 23  19 - 32 (mEq/L)  Glucose, Bld 142 (*) 70 - 99 (mg/dL)    BUN 9  6 - 23 (mg/dL)    Creatinine, Ser 1.47  0.50 - 1.10 (mg/dL)    Calcium 9.9  8.4 - 10.5 (mg/dL)    Total Protein 8.0  6.0 - 8.3 (g/dL)    Albumin 4.2  3.5 - 5.2 (g/dL)    AST 18  0 - 37 (U/L)    ALT 28  0 - 35 (U/L)    Alkaline Phosphatase 62  39 - 117 (U/L)    Total Bilirubin 0.3  0.3 - 1.2 (mg/dL)    GFR calc non Af Amer >90  >90 (mL/min)    GFR calc Af Amer >90  >90 (mL/min)   LIPASE, BLOOD     Status: Normal   Collection Time   01/12/12  9:00 PM      Component Value Range Comment   Lipase 41  11 - 59 (U/L)   URINALYSIS, ROUTINE W REFLEX MICROSCOPIC     Status: Abnormal   Collection Time   01/12/12  9:32 PM      Component Value Range Comment   Color, Urine YELLOW  YELLOW     APPearance CLEAR  CLEAR     Specific Gravity, Urine 1.016  1.005 - 1.030     pH 6.0  5.0 - 8.0     Glucose, UA NEGATIVE  NEGATIVE (mg/dL)    Hgb urine dipstick NEGATIVE  NEGATIVE     Bilirubin Urine NEGATIVE  NEGATIVE     Ketones, ur 40 (*) NEGATIVE (mg/dL)    Protein, ur NEGATIVE  NEGATIVE (mg/dL)    Urobilinogen, UA 0.2  0.0 - 1.0 (mg/dL)    Nitrite  NEGATIVE  NEGATIVE     Leukocytes, UA NEGATIVE  NEGATIVE  MICROSCOPIC NOT DONE ON URINES WITH NEGATIVE PROTEIN, BLOOD, LEUKOCYTES, NITRITE, OR GLUCOSE <1000 mg/dL.  PREGNANCY, URINE     Status: Normal   Collection Time   01/12/12  9:32 PM      Component Value Range Comment   Preg Test, Ur NEGATIVE  NEGATIVE      US Abdomen Complete  01/12/2012  *RADIOLOGY REPORT*  Clinical Data:  Abdominal pain for several hours.  COMPLETE ABDOMINAL ULTRASOUND  Comparison:  CT 02/17/2007  Findings:  Gallbladder:  Multiple stones in the dependent portion of the gallbladder.  One stone appears to be fixed in the gallbladder neck.  No significant gallbladder distension, wall thickening, or pericholecystic edema.  Murphy's sign is positive.  Changes are nonspecific but could be associated with acute cholecystitis in the appropriate clinical setting.  Common bile duct:  Normal caliber, measured at 4 mm diameter.  Liver:  Diffusely increased hepatic parenchymal echotexture suggesting fatty infiltration.  No focal lesions demonstrated.  IVC:  Appears normal.  Pancreas:  The pancreas is mostly obscured by overlying bowel gas. Visualized portions of the head and tail are unremarkable.  Spleen:  Spleen length measures 10 cm.  Normal homogeneous parenchymal echotexture.  Right Kidney:  Right kidney measures 11.6 cm length.  No hydronephrosis.  Left Kidney:  Left kidney measures 12 cm length.  No hydronephrosis.  Abdominal aorta:  Normal caliber.  No aneurysm.  IMPRESSION: Cholelithiasis with stone in the gallbladder neck.  No wall thickening or pericholecystic edema, but Murphy's sign is positive. Changes are nonspecific but could be consistent with acute cholecystitis in the appropriate clinical setting.  Diffuse fatty infiltration of the liver.  Original Report Authenticated By: Marlon Pel, M.D.  Blood pressure 147/80, pulse 87, temperature 97.7 F (36.5 C), temperature source Oral, resp. rate 20, last menstrual  period 01/06/2012, SpO2 100.00%. General appearance: alert, cooperative and no distress Head: Normocephalic, without obvious abnormality, atraumatic Neck: no JVD and supple, symmetrical, trachea midline Resp: nonlabored Cardio: normal rate, regular GI: soft, moderate RUQ tenderness, focal in RUQ, no other abdominal pain, ND, no peritonitis Extremities: extremities normal, atraumatic, no cyanosis or edema Neurologic: Grossly normal  Assessment/Plan: Symptomatic cholelithiasis and acute cholecystitis Her symptoms sound pretty classic for symptomatic cholelithiasis. Given the gallstones found on ultrasound and her symptoms, I do think that this is likely related to her gallbladder. Because this has been going on for 6 hours no without any relief concern that she may have acute cholecystitis as well I discussed with her the options for nonoperative treatment or delayed elective treatment vs. Surgery.  I have recommended cholecystectomy. I also recommended admission for pain control and IV fluids. I discussed with her the procedure and its risks. The risks of infection, bleeding, pain, persistent symptoms, scarring, injury to bowel or bile ducts, retained stone, diarrhea, need for additional procedures, and need for open surgery discussed with the patient. We will plan to admit her and proceed with surgery during this admission.   Lodema Pilot DAVID 01/13/2012, 12:34 AM

## 2012-01-13 NOTE — ED Provider Notes (Signed)
Medical screening examination/treatment/procedure(s) were conducted as a shared visit with non-physician practitioner(s) and myself.  I personally evaluated the patient during the encounter   41 yo F, c/o gradual onset and persistence of constant RUQ "pain" that began after eating fatty meal tonight PTA.  Has been assoc with nausea.  Denies hx of same.  Denies vomiting/diarrhea, no fevers.  Afebrile, VSS, resps easy, +RUQ pain.  LFT's and WBC count normal.  [place] with GB stones with one in GB neck, no clear signs of acute cholecystitis.  Pt continues in pain and nauseated despite multiple doses of IV meds.  Surgery Dr. Biagio Quint to come to ED for eval to admit.  Dx testing d/w pt and family.  Questions answered.  Verb understanding, agreeable to admit.   Laray Anger, DO 01/13/12 1136

## 2012-01-13 NOTE — ED Notes (Signed)
Surgeon at bedside.  

## 2012-01-13 NOTE — Anesthesia Postprocedure Evaluation (Signed)
  Anesthesia Post-op Note  Patient: Sydney [place]  Procedure(s) Performed: Procedure(s) (LRB): LAPAROSCOPIC CHOLECYSTECTOMY WITH INTRAOPERATIVE CHOLANGIOGRAM (N/A)  Patient Location: PACU  Anesthesia Type: General  Level of Consciousness: oriented and sedated  Airway and Oxygen Therapy: Patient Spontanous Breathing and Patient connected to nasal cannula oxygen  Post-op Pain: mild  Post-op Assessment: Post-op Vital signs reviewed, Patient's Cardiovascular Status Stable, Respiratory Function Stable and Patent Airway  Post-op Vital Signs: stable  Complications: No apparent anesthesia complications

## 2012-01-13 NOTE — Transfer of Care (Signed)
Immediate Anesthesia Transfer of Care Note  Patient: Sydney [place]  Procedure(s) Performed: Procedure(s) (LRB): LAPAROSCOPIC CHOLECYSTECTOMY WITH INTRAOPERATIVE CHOLANGIOGRAM (N/A)  Patient Location: PACU  Anesthesia Type: General  Level of Consciousness: awake, alert , oriented and patient cooperative  Airway & Oxygen Therapy: Patient Spontanous Breathing and Patient connected to face mask oxygen  Post-op Assessment: Report given to PACU RN and Post -op Vital signs reviewed and stable  Post vital signs: Reviewed and stable  Complications: No apparent anesthesia complications

## 2012-01-13 NOTE — Progress Notes (Signed)
UR complete 

## 2012-01-13 NOTE — Discharge Instructions (Signed)
Laparoscopic Cholecystectomy Laparoscopic cholecystectomy is surgery to remove the gallbladder. The gallbladder is located slightly to the right of center in the abdomen, behind the liver. It is a concentrating and storage sac for the bile produced in the liver. Bile aids in the digestion and absorption of fats. Gallbladder disease (cholecystitis) is an inflammation of your gallbladder. This condition is usually caused by a buildup of gallstones (cholelithiasis) in your gallbladder. Gallstones can block the flow of bile, resulting in inflammation and pain. In severe cases, emergency surgery may be required. When emergency surgery is not required, you will have time to prepare for the procedure. Laparoscopic surgery is an alternative to open surgery. Laparoscopic surgery usually has a shorter recovery time. Your common bile duct may also need to be examined and explored. Your caregiver will discuss this with you if he or she feels this should be done. If stones are found in the common bile duct, they may be removed. LET YOUR CAREGIVER KNOW ABOUT:  Allergies to food or medicine.   Medicines taken, including vitamins, herbs, eyedrops, over-the-counter medicines, and creams.   Use of steroids (by mouth or creams).   Previous problems with anesthetics or numbing medicines.   History of bleeding problems or blood clots.   Previous surgery.   Other health problems, including diabetes and kidney problems.   Possibility of pregnancy, if this applies.  RISKS AND COMPLICATIONS All surgery is associated with risks. Some problems that may occur following this procedure include:  Infection.   Damage to the common bile duct, nerves, arteries, veins, or other internal organs such as the stomach or intestines.   Bleeding.   A stone may remain in the common bile duct.  BEFORE THE PROCEDURE  Do not take aspirin for 3 days prior to surgery or blood thinners for 1 week prior to surgery.   Do not eat or  drink anything after midnight the night before surgery.   Let your caregiver know if you develop a cold or other infectious problem prior to surgery.   You should be present 60 minutes before the procedure or as directed.  PROCEDURE  You will be given medicine that makes you sleep (general anesthetic). When you are asleep, your surgeon will make several small cuts (incisions) in your abdomen. One of these incisions is used to insert a small, lighted scope (laparoscope) into the abdomen. The laparoscope helps the surgeon see into your abdomen. Carbon dioxide gas will be pumped into your abdomen. The gas allows more room for the surgeon to perform your surgery. Other operating instruments are inserted through the other incisions. Laparoscopic procedures may not be appropriate when:  There is major scarring from previous surgery.   The gallbladder is extremely inflamed.   There are bleeding disorders or unexpected cirrhosis of the liver.   A pregnancy is near term.   Other conditions make the laparoscopic procedure impossible.  If your surgeon feels it is not safe to continue with a laparoscopic procedure, he or she will perform an open abdominal procedure. In this case, the surgeon will make an incision to open the abdomen. This gives the surgeon a larger view and field to work within. This may allow the surgeon to perform procedures that sometimes cannot be performed with a laparoscope alone. Open surgery has a longer recovery time. AFTER THE PROCEDURE  You will be taken to the recovery area where a nurse will watch and check your progress.   You may be allowed to go home   the same day.   Do not resume physical activities until directed by your caregiver.   You may resume a normal diet and activities as directed.  Document Released: 09/09/2005 Document Revised: 08/29/2011 Document Reviewed: 02/22/2011 Exit[employer] Patient Information 2012 [employer].CCS ______[facility] LAPAROSCOPIC SURGERY: POST OP INSTRUCTIONS Always review your discharge instruction sheet given to you by the facility where your surgery was performed. IF YOU HAVE DISABILITY OR FAMILY LEAVE FORMS, YOU MUST BRING THEM TO THE OFFICE FOR PROCESSING.   DO NOT GIVE THEM TO YOUR DOCTOR.  1. A prescription for pain medication may be given to you upon discharge.  Take your pain medication as prescribed, if needed.  If narcotic pain medicine is not needed, then you may take acetaminophen (Tylenol) or ibuprofen (Advil) as needed. 2. Take your usually prescribed medications unless otherwise directed. 3. If you need a refill on your pain medication, please contact your pharmacy.  They will contact our office to request authorization. Prescriptions will not be filled after 5pm or on week-ends. 4. You should follow a light diet the first few days after arrival home, such as soup and crackers, etc.  Be sure to include lots of fluids daily. 5. Most patients will experience some swelling and bruising in the area of the incisions.  Ice packs will help.  Swelling and bruising can take several days to resolve.  6. It is common to experience some constipation if taking pain medication after surgery.  Increasing fluid intake and taking a stool softener (such as Colace) will usually help or prevent this problem from occurring.  A mild laxative (Milk of Magnesia or Miralax) should be taken according to package instructions if there are no bowel movements after 48 hours. 7. Unless discharge instructions indicate otherwise, you may remove your bandages 24-48 hours after surgery, and you may shower at that time.  You may have steri-strips (small skin tapes) in place directly over the incision.  These strips should be left on the skin for 7-10 days.  If your surgeon used skin glue on the incision, you may shower in 24 hours.  The glue will flake off over the next 2-3 weeks.  Any sutures or staples will be removed at the  office during your follow-up visit. 8. ACTIVITIES:  You may resume regular (light) daily activities beginning the next day--such as daily self-care, walking, climbing stairs--gradually increasing activities as tolerated.  You may have sexual intercourse when it is comfortable.  Refrain from any heavy lifting or straining until approved by your doctor. a. You may drive when you are no longer taking prescription pain medication, you can comfortably wear a seatbelt, and you can safely maneuver your car and apply brakes. b. RETURN TO WORK:  __________________________________________________________ 9. You should see your doctor in the office for a follow-up appointment approximately 2-3 weeks after your surgery.  Make sure that you call for this appointment within a day or two after you arrive home to insure a convenient appointment time. 10. OTHER INSTRUCTIONS: __________________________________________________________________________________________________________________________ __________________________________________________________________________________________________________________________ WHEN TO CALL YOUR DOCTOR: 1. Fever over 101.0 2. Inability to urinate 3. Continued bleeding from incision. 4. Increased pain, redness, or drainage from the incision. 5. Increasing abdominal pain  The clinic staff is available to answer your questions during regular business hours.  Please don't hesitate to call and ask to speak to one of the nurses for clinical concerns.  If you have a medical emergency, go to the nearest emergency room or call 911.    A surgeon from [facility] is always on call at the hospital. [address], [address], [place], [place]  [address] ? [address] [address], , [place]   [address] [phone] ? [phone] ? FAX [phone] Web site: [email or web address]  

## 2012-01-13 NOTE — Brief Op Note (Signed)
01/12/2012 - 01/13/2012  8:07 AM  PATIENT:  Sydney [place]  41 y.o. female  PRE-OPERATIVE DIAGNOSIS:  cholecystitis  POST-OPERATIVE DIAGNOSIS:  cholecystitis  PROCEDURE:  Procedure(s) (LRB): LAPAROSCOPIC CHOLECYSTECTOMY WITH INTRAOPERATIVE CHOLANGIOGRAM (N/A)  SURGEON:  Surgeon(s) and Role:    * Lodema Pilot, DO - Primary  PHYSICIAN ASSISTANT:   ASSISTANTS: none   ANESTHESIA:   general  EBL:  Total I/O In: 1200 [I.V.:1200] Out: 50 [Blood:50]  BLOOD ADMINISTERED:none  DRAINS: none   LOCAL MEDICATIONS USED:  MARCAINE    and LIDOCAINE   SPECIMEN:  Source of Specimen:  gallbladder  DISPOSITION OF SPECIMEN:  PATHOLOGY  COUNTS:  YES  TOURNIQUET:  * No tourniquets in log *  DICTATION: .Other Dictation: Dictation Number   PLAN OF CARE: Admit for overnight observation  PATIENT DISPOSITION:  PACU - hemodynamically stable.   Delay start of Pharmacological VTE agent (>24hrs) due to surgical blood loss or risk of bleeding: no

## 2012-01-13 NOTE — Anesthesia Preprocedure Evaluation (Addendum)
Anesthesia Evaluation  Patient identified by MRN, date of birth, ID band Patient awake    Reviewed: Allergy & Precautions, H&P , NPO status , Patient's Chart, lab work & pertinent test results  Airway Mallampati: II TM Distance: >3 FB Neck ROM: Full    Dental No notable dental hx.    Pulmonary pneumonia ,  breath sounds clear to auscultation  Pulmonary exam normal       Cardiovascular hypertension, Pt. on medications Rhythm:Regular Rate:Normal     Neuro/Psych negative neurological ROS  negative psych ROS   GI/Hepatic negative GI ROS, Neg liver ROS,   Endo/Other  negative endocrine ROS  Renal/GU negative Renal ROS  negative genitourinary   Musculoskeletal negative musculoskeletal ROS (+)   Abdominal (+) + obese,   Peds negative pediatric ROS (+)  Hematology negative hematology ROS (+)   Anesthesia Other Findings   Reproductive/Obstetrics negative OB ROS                           Anesthesia Physical Anesthesia Plan  ASA: II  Anesthesia Plan: General   Post-op Pain Management:    Induction: Intravenous  Airway Management Planned: Oral ETT  Additional Equipment:   Intra-op Plan:   Post-operative Plan: Extubation in OR  Informed Consent: I have reviewed the patients History and Physical, chart, labs and discussed the procedure including the risks, benefits and alternatives for the proposed anesthesia with the patient or authorized representative who has indicated his/her understanding and acceptance.   Dental advisory given  Plan Discussed with: CRNA  Anesthesia Plan Comments:         Anesthesia Quick Evaluation

## 2012-01-14 LAB — COMPREHENSIVE METABOLIC PANEL
ALT: 52 U/L — ABNORMAL HIGH (ref 0–35)
AST: 40 U/L — ABNORMAL HIGH (ref 0–37)
Albumin: 3.1 g/dL — ABNORMAL LOW (ref 3.5–5.2)
Alkaline Phosphatase: 52 U/L (ref 39–117)
CO2: 27 mEq/L (ref 19–32)
Chloride: 101 mEq/L (ref 96–112)
Creatinine, Ser: 0.73 mg/dL (ref 0.50–1.10)
GFR calc non Af Amer: >90 mL/min (ref >90)
Potassium: 3.1 mEq/L — ABNORMAL LOW (ref 3.5–5.1)
Total Bilirubin: 0.4 mg/dL (ref 0.3–1.2)

## 2012-01-14 LAB — CBC
MCV: 98 fL (ref 78.0–100.0)
Platelets: 213 10*3/uL (ref 150–400)
RBC: 3.43 MIL/uL — ABNORMAL LOW (ref 3.87–5.11)
RDW: 12.9 % (ref 11.5–15.5)
WBC: 5.9 10*3/uL (ref 4.0–10.5)

## 2012-01-14 MED ORDER — HYDROCODONE-ACETAMINOPHEN 5-325 MG PO TABS: 1.0000 | ORAL_TABLET | ORAL | Status: AC | PRN

## 2012-01-14 MED ORDER — ACETAMINOPHEN 325 MG PO TABS: 650.0000 mg | ORAL_TABLET | Freq: Four times a day (QID) | ORAL | Status: AC | PRN

## 2012-01-14 MED ORDER — POTASSIUM CHLORIDE CRYS ER 20 MEQ PO TBCR
30.0000 meq | EXTENDED_RELEASE_TABLET | Freq: Three times a day (TID) | ORAL | Status: DC
  Administered 2012-01-14: 30 meq via ORAL
  Filled 2012-01-14 (×3): qty 1

## 2012-01-14 MED ORDER — IBUPROFEN 200 MG PO TABS: 200.0000 mg | ORAL_TABLET | Freq: Four times a day (QID) | ORAL | Status: AC | PRN

## 2012-01-14 NOTE — Progress Notes (Signed)
1 Day Post-Op  Subjective: Ready to go home.  Objective: Vital signs in last 24 hours: Temp:  [97.6 F (36.4 C)-98.8 F (37.1 C)] 98.1 F (36.7 C) (04/23 0604) Pulse Rate:  [60-81] 69  (04/23 0604) Resp:  [18-20] 18  (04/23 0604) BP: (100-126)/(60-71) 100/62 mmHg (04/23 0604) SpO2:  [93 %-98 %] 94 % (04/23 0604) Last BM Date: 01/12/12 Afebrile, VSS, K+ is low Intake/Output from previous day: 04/22 0701 - 04/23 0700 In: 2900 [P.O.:1200; I.V.:1700] Out: 3825 [Urine:3775; Blood:50] Intake/Output this shift:    General appearance: alert, cooperative and no distress GI: soft, tender, wounds ok tolerating full diet.  Lab Results:   Basename 01/14/12 0425 01/12/12 2100  WBC 5.9 9.7  HGB 11.0* 13.9  HCT 33.6* 40.1  PLT 213 305    BMET  Basename 01/14/12 0425 01/12/12 2100  NA 135 138  K 3.1* 3.3*  CL 101 101  CO2 27 23  GLUCOSE 87 142*  BUN 5* 9  CREATININE 0.73 0.75  CALCIUM 8.3* 9.9   PT/INR No results found for this basename: LABPROT:2,INR:2 in the last 72 hours   Lab 01/14/12 0425 01/12/12 2100  AST 40* 18  ALT 52* 28  ALKPHOS 52 62  BILITOT 0.4 0.3  PROT 6.4 8.0  ALBUMIN 3.1* 4.2     Lipase     Component Value Date/Time   LIPASE 41 01/12/2012 2100     Studies/Results: Dg Cholangiogram Operative  01/13/2012  *RADIOLOGY REPORT*  Clinical Data:   Cholelithiasis.  INTRAOPERATIVE CHOLANGIOGRAM  Technique:  Cholangiographic images from the C-arm fluoroscopic device were submitted for interpretation post-operatively.  Please see the procedural report for the amount of contrast and the fluoroscopy time utilized.  Comparison:  Abdominal ultrasound 01/12/2012  Findings:  Trunk angiogram demonstrates that the common hepatic and common bile duct are widely patent with free flow of contrast into the duodenum.  Incomplete filling of the intrahepatic ducts.  IMPRESSION: Normal intraoperative cholangiogram.  Original Report Authenticated By: Gwynn Burly, M.D.    US Abdomen Complete  01/12/2012  *RADIOLOGY REPORT*  Clinical Data:  Abdominal pain for several hours.  COMPLETE ABDOMINAL ULTRASOUND  Comparison:  CT 02/17/2007  Findings:  Gallbladder:  Multiple stones in the dependent portion of the gallbladder.  One stone appears to be fixed in the gallbladder neck.  No significant gallbladder distension, wall thickening, or pericholecystic edema.  Murphy's sign is positive.  Changes are nonspecific but could be associated with acute cholecystitis in the appropriate clinical setting.  Common bile duct:  Normal caliber, measured at 4 mm diameter.  Liver:  Diffusely increased hepatic parenchymal echotexture suggesting fatty infiltration.  No focal lesions demonstrated.  IVC:  Appears normal.  Pancreas:  The pancreas is mostly obscured by overlying bowel gas. Visualized portions of the head and tail are unremarkable.  Spleen:  Spleen length measures 10 cm.  Normal homogeneous parenchymal echotexture.  Right Kidney:  Right kidney measures 11.6 cm length.  No hydronephrosis.  Left Kidney:  Left kidney measures 12 cm length.  No hydronephrosis.  Abdominal aorta:  Normal caliber.  No aneurysm.  IMPRESSION: Cholelithiasis with stone in the gallbladder neck.  No wall thickening or pericholecystic edema, but Murphy's sign is positive. Changes are nonspecific but could be consistent with acute cholecystitis in the appropriate clinical setting.  Diffuse fatty infiltration of the liver.  Original Report Authenticated By: Marlon Pel, M.D.    Medications:    . ampicillin-sulbactam (UNASYN) IV  3 g Intravenous  Q6H  . enoxaparin  40 mg Subcutaneous Q24H  . potassium chloride  30 mEq Oral TID WC  . DISCONTD: acetaminophen  1,000 mg Intravenous Q6H  . DISCONTD: HYDROmorphone      . DISCONTD: HYDROmorphone      . DISCONTD: HYDROmorphone        Assessment/Plan Acute cholecystitis, cholelithiasis, s/p lap cholecystectomy 6AM 01/13/12 Dr. Biagio Quint  Hypertension BMI 36    PLAN:  HOME TODAY.    LOS: 2 days    Sydney [place] 01/14/2012

## 2012-01-14 NOTE — Discharge Summary (Signed)
Physician Discharge Summary  Patient ID: Sydney [place] MRN: [id number] DOB/AGE: Apr 14, 1971 41 y.o.  Admit date: 01/12/2012 Discharge date: 01/14/2012  Admission Diagnoses: Acute Cholecystitis, Cholelithiasis  Discharge Diagnoses: Same Active Problems:  * No active hospital problems. *    PROCEDURES: s/p lap cholecystectomy 6AM 01/13/12 Dr. Wynelle Beckmann Course: this patient presents to the emergency room for evaluation of acute onset of right upper quadrant pain beginning this evening at 6 PM approximately one hour after eating dinner. She denies any prior episodes of similar pain. She says that approximately one hour after E. She had acute onset of right upper quadrant pain which felt like someone "hit her in the stomach". She says it began in the epigastrium and radiated around to her back and has been persistent since it started. She thought that she ate too much dinner and she tried to make herself vomit but she got no relief from this. She has no other associated symptoms. She denies any fevers or chills. She states that her bowels are functioning. She denies any reflux symptoms. She denies any blood in the stools or melena. Pt was admitted and underwent surgery.  She tolerated procedure well.  She had a fair amount of post op discomfort.  By 1st POD she was doing well eating a regular diet and ready for D/C  K+ low, she is on HCTZ at home.  I have given her some PO KCl here and will tell her to take some High K+ foods in at home.  Her incisions look fine and we will D/C home today.   FOLLOW UP : 2 weeks Dr. Biagio Quint  CONDITION ON D/C:  Improved.  Disposition: Final discharge disposition not confirmed   Medication List  As of 01/14/2012 10:47 AM   TAKE these medications         acetaminophen 325 MG tablet   Commonly known as: TYLENOL   Take 2 tablets (650 mg total) by mouth every 6 (six) hours as needed for pain.      amLODipine 5 MG tablet   Commonly known as: NORVASC   Take 5 mg by mouth daily.      FLUoxetine 20 MG capsule   Commonly known as: PROZAC   Take 20 mg by mouth daily.      hydrochlorothiazide 25 MG tablet   Commonly known as: HYDRODIURIL   Take 25 mg by mouth daily.      HYDROcodone-acetaminophen 5-325 MG per tablet   Commonly known as: NORCO   Take 1-2 tablets by mouth every 4 (four) hours as needed.      ibuprofen 200 MG tablet   Commonly known as: ADVIL,MOTRIN   Take 1 tablet (200 mg total) by mouth every 6 (six) hours as needed for pain.      mulitivitamin with minerals Tabs   Take 1 tablet by mouth daily.      norethindrone-ethinyl estradiol 1-20 MG-MCG tablet   Commonly known as: JUNEL FE,GILDESS FE,LOESTRIN FE   Take 1 tablet by mouth daily.      OVER THE COUNTER MEDICATION   Take 1 tablet by mouth. 360 weight loss           Follow-up Information    Follow up with LAYTON, BRIAN DAVID, DO. Schedule an appointment as soon as possible for a visit in 2 weeks.   Contact information:   [address] [place] [address] [phone]       Follow  up with Return to work 7-14 days.  no liftine over 20 lbs for 30 days..         SignedSherrie George 01/14/2012, 10:47 AM

## 2012-01-14 NOTE — Progress Notes (Signed)
Patient discharge to home, vital signs are stable, patient with very little pain today, tolerated diet well, incisions to abdomen are within normal limits, no complaints of nausea or vomiting, discharge instructions reviewed with patient and patient's family, prescription given and patient to follow up with MD for follow up appointment Means, Myrtie Hawk RN 01-14-12

## 2012-01-14 NOTE — Progress Notes (Signed)
Agree with above.  Wilmon Arms. Corliss Skains, MD, [facility]  01/14/2012 1:05 PM

## 2012-01-14 NOTE — Discharge Summary (Signed)
Ready for discharge.  Wilmon Arms. Corliss Skains, MD, [facility]  01/14/2012 1:05 PM

## 2012-01-14 NOTE — Op Note (Signed)
NAMEBABBETTE, Sydney Woodward NO.:  [id number]  MEDICAL RECORD NO.:  [id number]  LOCATION:  1526                         FACILITY:  [facility]  PHYSICIAN:  Lodema Pilot, MD       DATE OF BIRTH:  October 13, 1970  DATE OF PROCEDURE:  01/13/2012 DATE OF DISCHARGE:                              OPERATIVE REPORT   PROCEDURE:  Laparoscopic cholecystectomy with intraoperative cholangiogram.  PREOPERATIVE DIAGNOSIS:  Acute cholecystitis.  POSTOPERATIVE DIAGNOSIS:  Acute cholecystitis.  SURGEON:  Lodema Pilot, MD  ASSISTANT:  None.  ANESTHESIA:  General endotracheal anesthesia with 40 cc of 1% lidocaine and 0.25% Marcaine with epinephrine in a 50:50 mixture.  FLUIDS:  1 L crystalloid.  ESTIMATED BLOOD LOSS:  Minimal.  DRAINS:  None.  SPECIMENS:  Gallbladder and contents sent to Pathology for permanent sectioning.  COMPLICATION:  None apparent.  FINDINGS:  Acute cholecystitis with cholelithiasis, normal cholangiogram.  She did have some ascitic fluid in her abdomen, otherwise normal anatomy.  INDICATION FOR PROCEDURE:  Ms. Sydney Woodward is a 41 year old female with 6- hour history of persistent right upper quadrant pain after eating.  She presented to the emergency room for evaluation and noted to have gallstone.  She still had right upper quadrant tenderness focal in the subcostal region concerning for acute cholecystitis.  OPERATIVE DETAILS:  Ms. Sydney Woodward was seen and evaluated in the preoperative area.  Risks and benefits of procedure were again discussed in lay terms.  Informed consent was obtained.  She was given therapeutic antibiotics, taken to the operating room, placed on table in supine position.  General endotracheal anesthesia was obtained and her abdomen was prepped and draped in a standard surgical fashion.  Procedure time- out was performed with all operative team members to confirmed proper patient, procedure, and a semicircular infraumbilical incision was made in  the skin and dissection was carried down through the subcutaneous tissue using blunt dissection.  The abdominal wall fascia was elevated and sharply incised and the peritoneum entered sharply.  The 12-mm balloon trocar was placed at the umbilicus and pneumoperitoneum was obtained.  Laparoscope was introduced and there was no evidence of bowel injury upon entry.  Then an 11-mm epigastric trocar and two 5-mm right upper quadrant trocars were placed under direct visualization.  She did have some ascites type fluid, a small amount ascites around the liver, but the liver appeared normal.  The gallbladder appeared edematous, and tense  consistent with acute cholecystitis.  The intra-abdominal fluid was likely due to the acute inflammatory process in the right upper quadrant.  The gallbladder was retracted cephalad, exposing the Hartman's pouch.  She had a stone impacted near the cystic duct, but I was able to grab the gallbladder in this area and retract it laterally. The peritoneum was taken down bluntly.  The cystic artery was seen in its usual anatomic position coursing up on the gallbladder near the lymph node of Calot.  This was skeletonized and clipped between hemoclips and divided, which opened up the triangle of Calot further.  A window was created through the triangle of Calot.  The cystic duct was skeletonized and isolated and the critical view of safety was obtained  visualizing a single cystic duct coursing out of the gallbladder and the liver parenchyma through the triangle of Calot, could actually see the area of the common duct as well.  A clip was placed on the gallbladder side of the cystic duct.  A small cystic ductotomy was made and the cholangiogram catheter was passed through the abdominal wall into the cystic duct.  A cholangiogram was performed which demonstrated normal right and left hepatic ducts, normal common bile duct without any evidence of filling defects and free  flow bile into the duodenum.  The catheter was removed and the duct was clipped with 3 clips on the stay side, and the duct was divided.  Then the gallbladder was elevated from the critical structures.  She did have a large arterial structure closely approximating  the gallbladder and I elevated the gallbladder from this area, but the artery was not entered andso eventually dove into the liver.  I was able to elevate the gallbladder from the gallbladder fossa using Bovie electrocautery and it was completely removed from the gallbladder fossa and placed in an EndoCatch bag and removed from the umbilicus.  She had 2 moderate size gallstones palpable in the gallbladder.  This was sent to Pathology for permanent sectioning.  The gallbladder fossa was inspected for hemostasis, which was noted to be adequate and with an inflammatory nature between inflammatory process and acute inflammation.  I did place some Surgicel gauze in the gallbladder fossa.  The area was irrigated with sterile saline solution and the irrigation suctioned from the abdomen returned clear.  The gallbladder fossa was again noted to be hemostatic and the right upper quadrant trocars were removed under direct visualization of the abdominal wall, was noted be hemostatic.  The trocars were removed and the fascia was approximated with interrupted 0 Vicryl sutures in an open fashion.  Sutures were then secured and the abdomen reinsufflated through the epigastric trocar site.  The abdominal wall closure was noted to be adequate without any evidence of bleeding or bowel injury, and the right upper quadrant appeared hemostatic.  The trocar was removed, and the skin edges were injected with a total of 40 cc of 1% lidocaine with epinephrine, 0.25% Marcaine in a 50:50 mixture.  The skin edges were approximated with 4-0 Monocryl in subcuticular suture.  The skin was washed and dried and Dermabond was applied.  All sponge, needle, and  instrument counts were correct.  The patient tolerated the procedure well without apparent complication.          ______________________________ Lodema Pilot, MD     BL/MEDQ  D:  01/13/2012  T:  01/14/2012  Job:  [id number]

## 2012-01-20 ENCOUNTER — Encounter (HOSPITAL_COMMUNITY): Payer: Self-pay | Admitting: General Surgery

## 2012-02-07 ENCOUNTER — Ambulatory Visit (INDEPENDENT_AMBULATORY_CARE_PROVIDER_SITE_OTHER): Payer: PRIVATE HEALTH INSURANCE | Admitting: General Surgery

## 2012-02-07 ENCOUNTER — Encounter (INDEPENDENT_AMBULATORY_CARE_PROVIDER_SITE_OTHER): Payer: Self-pay | Admitting: General Surgery

## 2012-02-07 ENCOUNTER — Encounter (INDEPENDENT_AMBULATORY_CARE_PROVIDER_SITE_OTHER): Payer: PRIVATE HEALTH INSURANCE | Admitting: General Surgery

## 2012-02-07 VITALS — BP 124/74 | HR 105 | Temp 98.2°F | Ht 69.0 in | Wt 239.8 lb

## 2012-02-07 DIAGNOSIS — Z5189 Encounter for other specified aftercare: Secondary | ICD-10-CM

## 2012-02-07 DIAGNOSIS — Z4889 Encounter for other specified surgical aftercare: Secondary | ICD-10-CM

## 2012-02-07 NOTE — Progress Notes (Signed)
Subjective:     Patient ID: Sydney Woodward, female   DOB: 11/08/1970, 41 y.o.   MRN: [id number]  HPI This patient follows up status post-laparoscopic cholecystectomy. She seemed to doing very well has no complaints. She is tolerating regular diet and her bowels are normal. Her pathology is benign and she is off pain medication.  Review of Systems     Objective:   Physical Exam Her abdomen is soft and nontender on exam her incisions are healing well without sign of infection or hernia    Assessment:     Status post laparoscopic cholecystectomy-doing well She seems to be doing very well since her procedure. There is no evidence of any postoperative complications. Her pathology is benign. She can return to activity as tolerated.    Plan:     Followup p.r.n.

## 2012-10-25 IMAGING — US US EXTREM LOW VENOUS*R*
1 series · 14 of 24 positions shown · non-contrast
Comparison: none

CLINICAL DATA: Symptomatic bilateral lower extremity   varicose
veins, now 1 week status post transcatheter laser occlusion of the
right greater saphenous vein.

RIGHT LOWER EXTREMITY VENOUS DOPPLER ULTRASOUND
TECHNIQUE: Gray-scale sonography with compression as well as color
and duplex Doppler ultrasound were performed to evaluate   the
saphenous vein and the deep venous system from the level of the
common femoral vein through the popliteal and proximal calf veins.

[Series 1: us extrem low venous*right* · 14 of 28 slices shown]
[im 1/28]
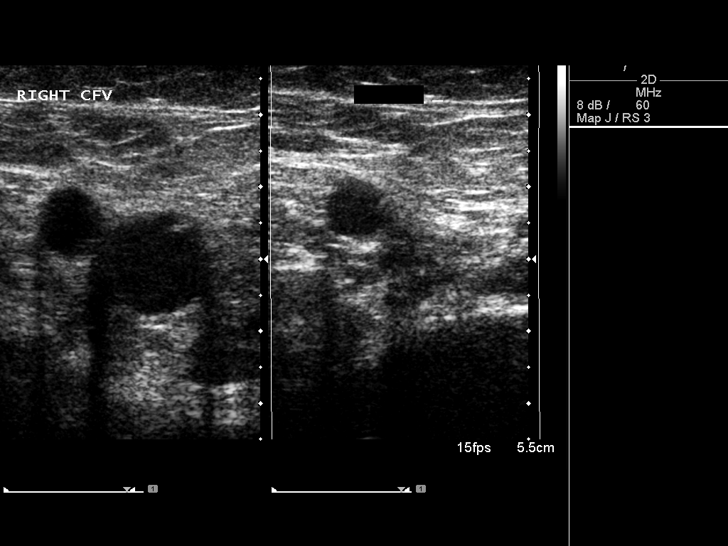
[im 3/28]
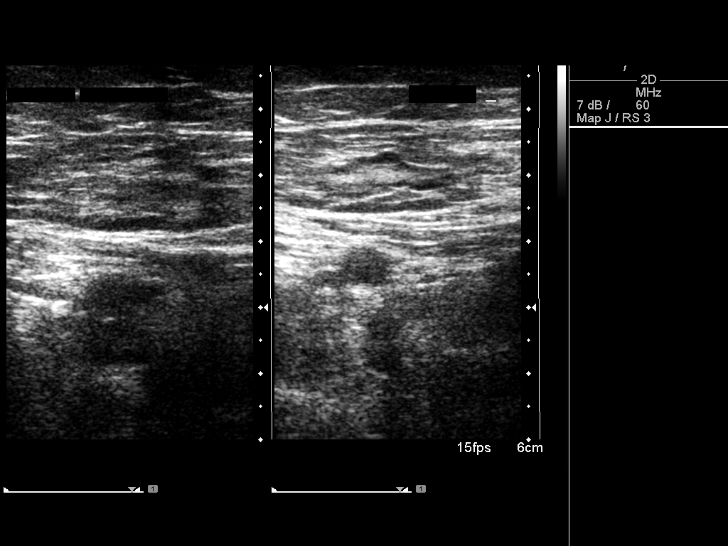
[im 5/28]
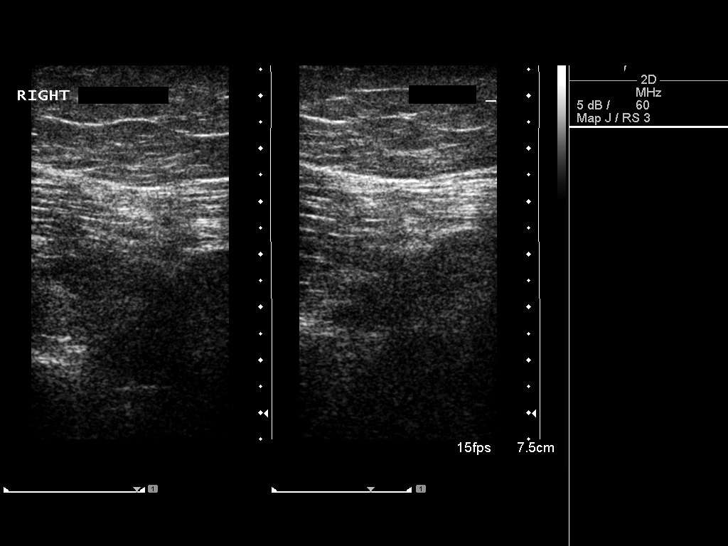
[im 8/28]
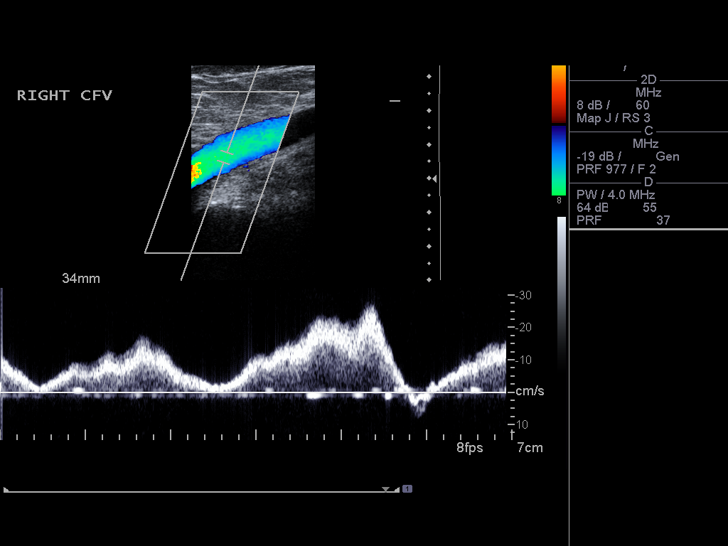
[im 9/28]
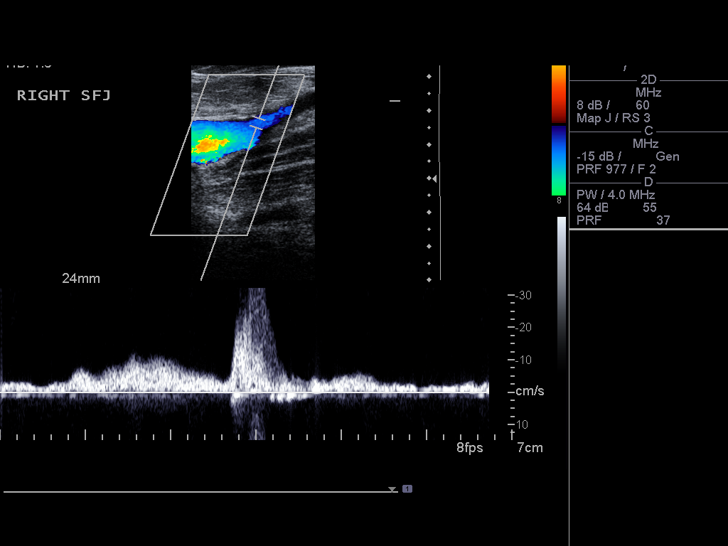
[im 11/28]
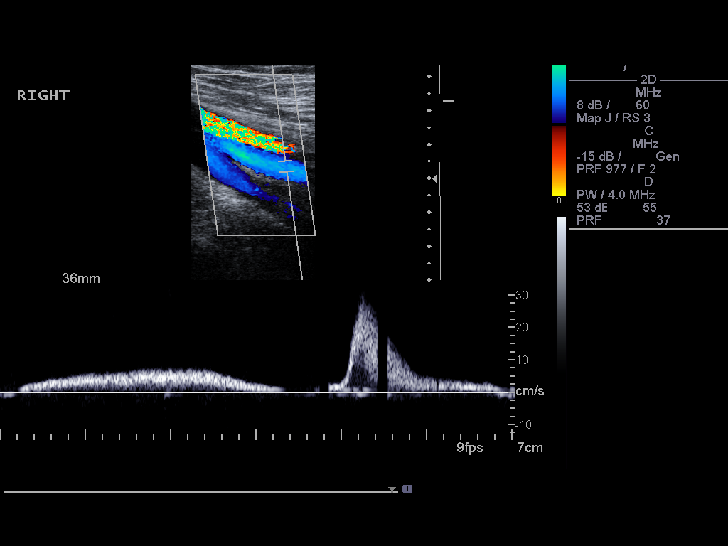
[im 13/28]
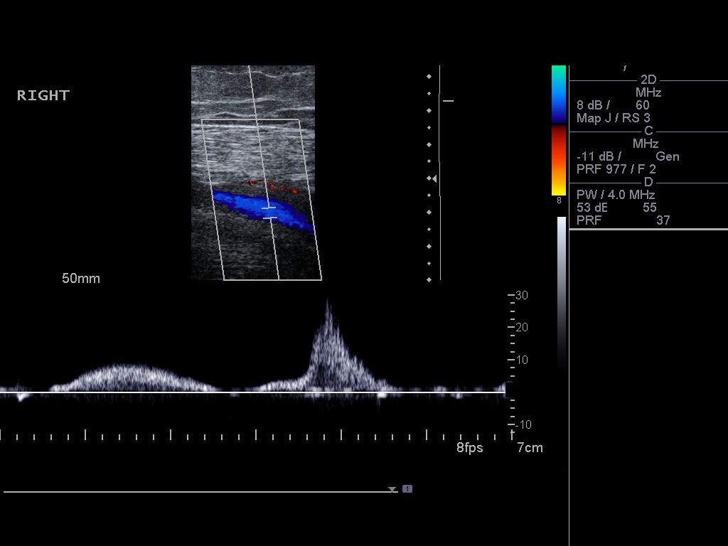
[im 15/28]
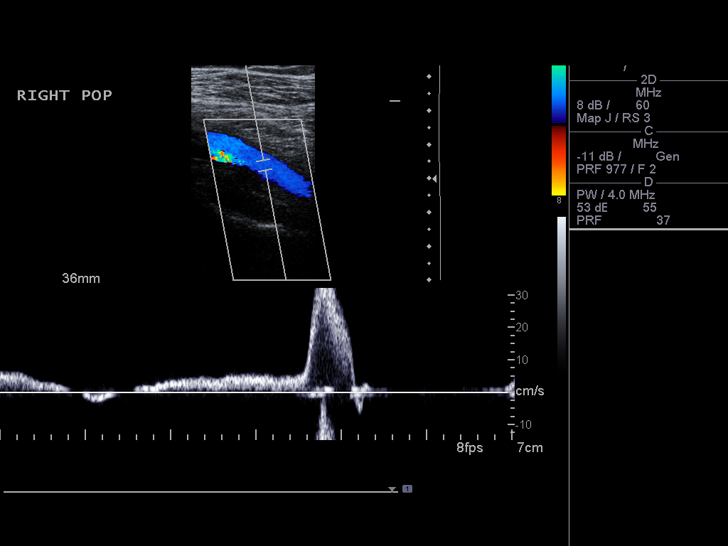
[im 17/28]
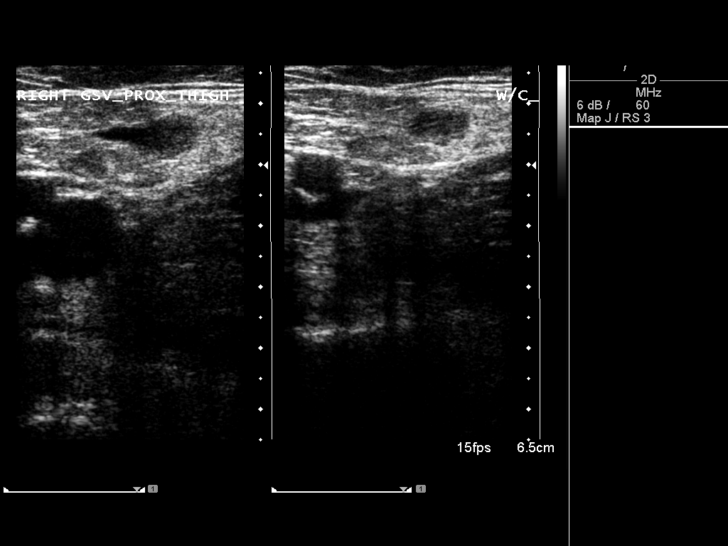
[im 19/28]
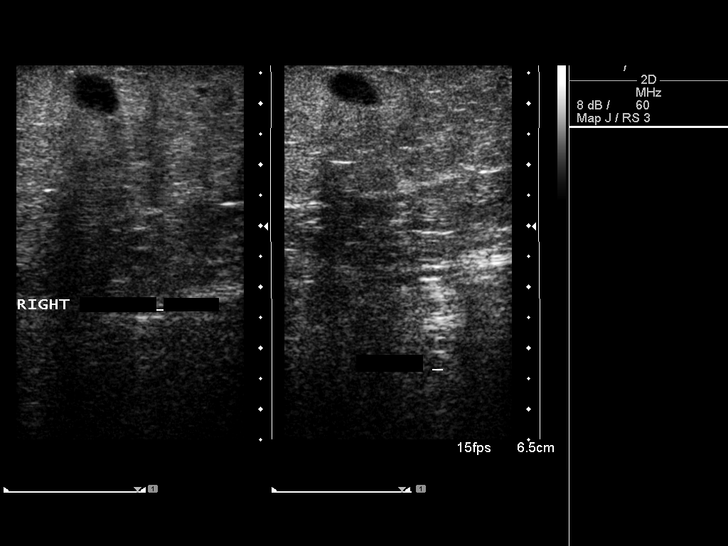
[im 22/28]
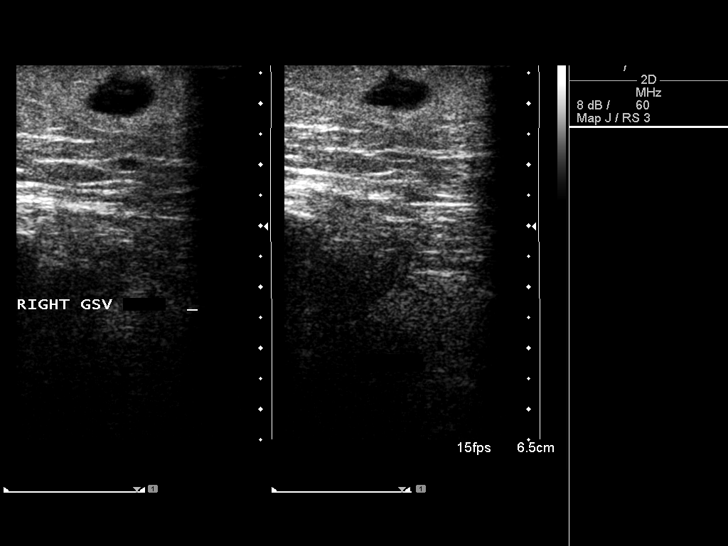
[im 23/28]
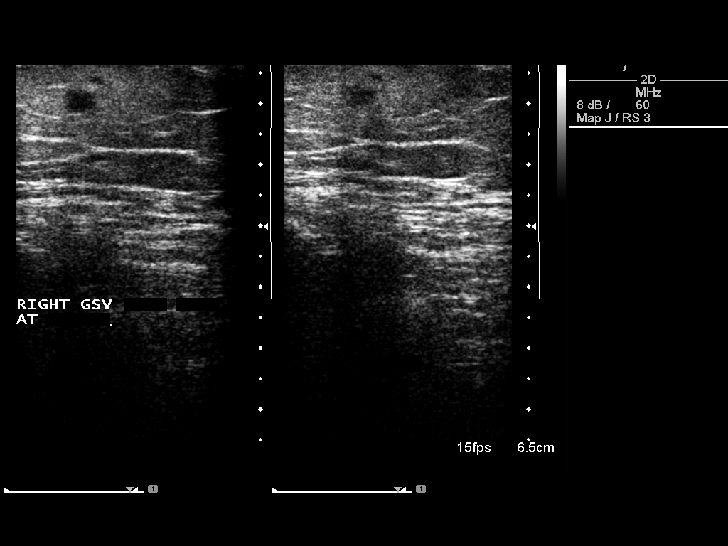
[im 25/28]
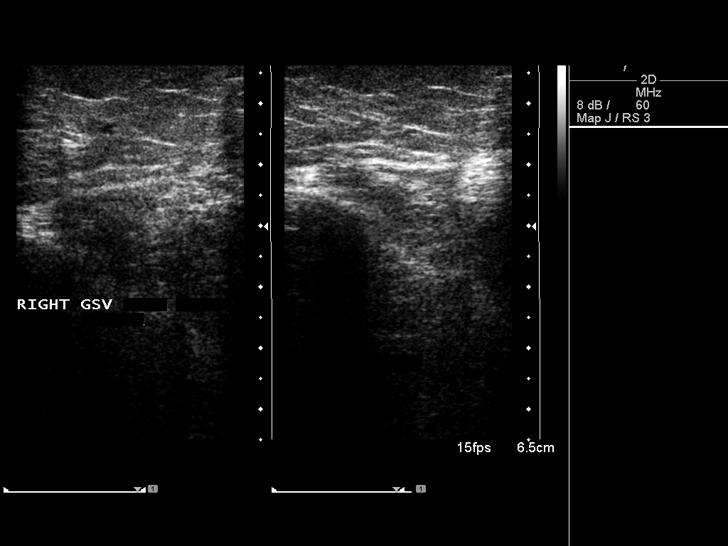
[im 28/28]
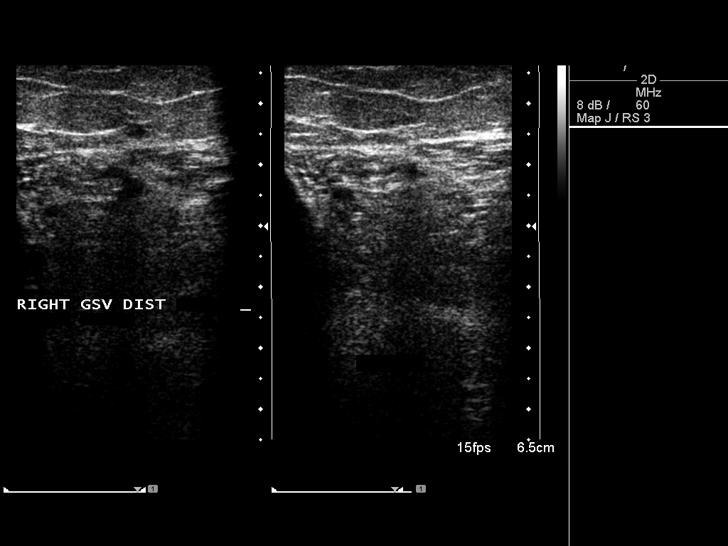

[14 of 24 positions shown; findings below may reference images not displayed]

FINDINGS: The lower extremity deep venous system demonstrates
normal compressibility, phasicity, and augmentation.  Visualized
portions of posterior tibial veins unremarkable. No evidence of
DVT.

The greater saphenous vein is occluded throughout the treatment
segment. Associated varicose veins in the   calf region are
decompressed.

IMPRESSION
1. Technically successful occlusion of the greater saphenous vein
along the treatment length without apparent complication.
2. Normal deep venous system. No DVT.

## 2012-11-16 IMAGING — US US EXTREM LOW VENOUS*R*
1 series · 14 of 24 positions shown · non-contrast
Comparison: 10/02/2010

CLINICAL DATA: 1 month status post right G S V transcatheter laser
occlusion with ultrasound varicose vein sclerotherapy

RIGHT LOWER EXTREMITY VENOUS DUPLEX ULTRASOUND
TECHNIQUE: Gray-scale sonography with graded compression, as well
as color Doppler and duplex ultrasound, were performed to evaluate
the deep and superficial veins of the right lower extremity.
Spectral Doppler was utilized to evaluate flow at rest and with
distal augmentation maneuvers.  A complete superficial venous
insufficiency exam was performed in the upright standing position.
I personally performed the technical portion of the exam.

[Series 1: us extrem low venous*right* · 14 of 29 slices shown]
[im 1/29]
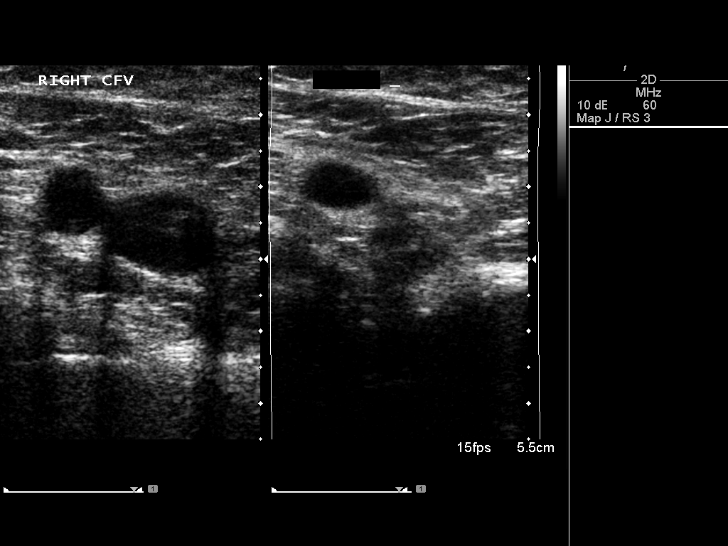
[im 3/29]
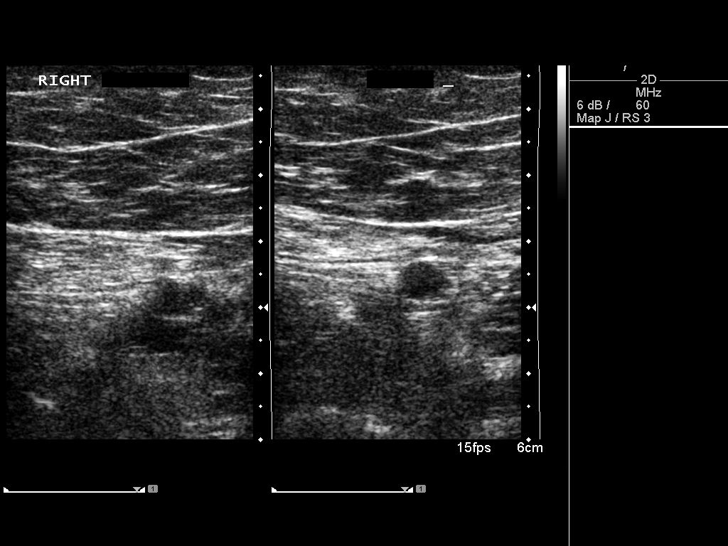
[im 5/29]
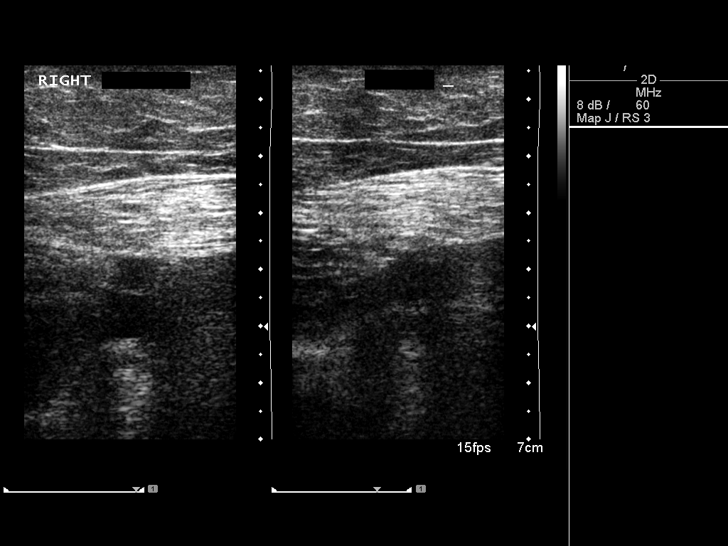
[im 8/29]
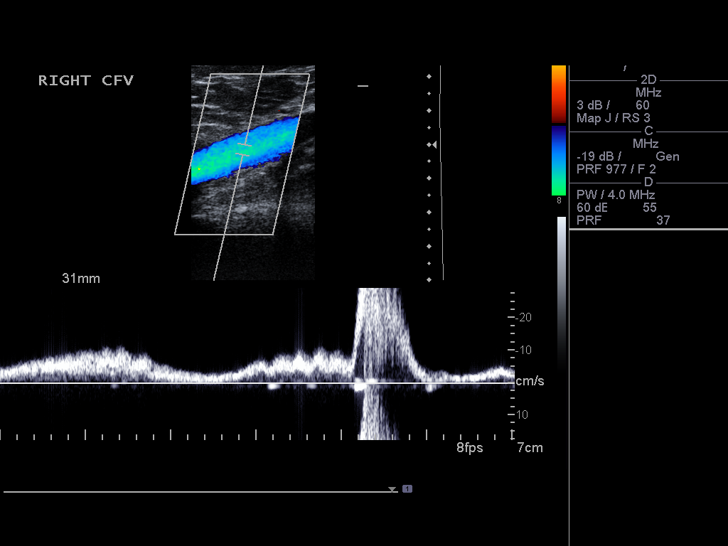
[im 9/29]
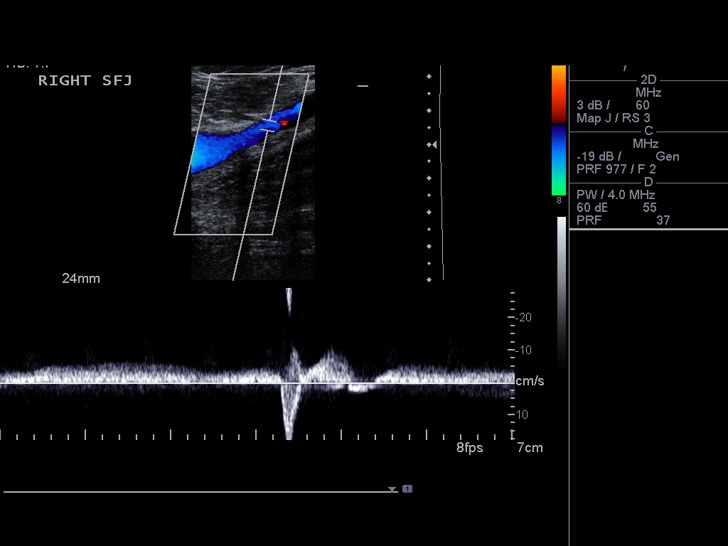
[im 11/29]
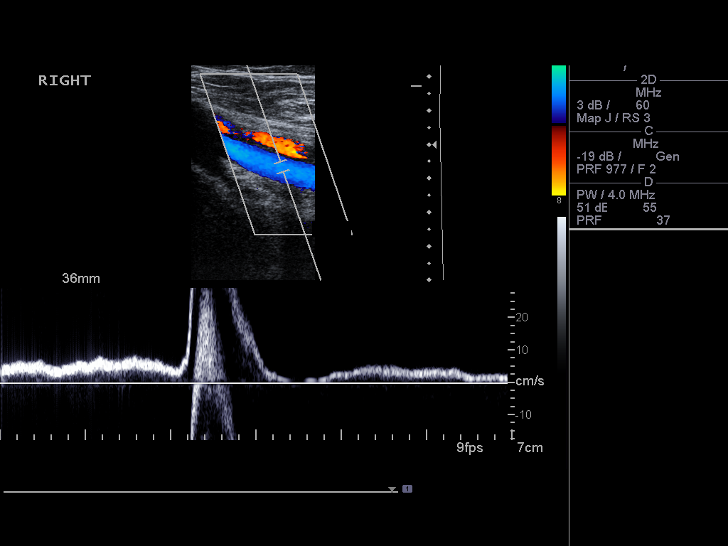
[im 14/29]
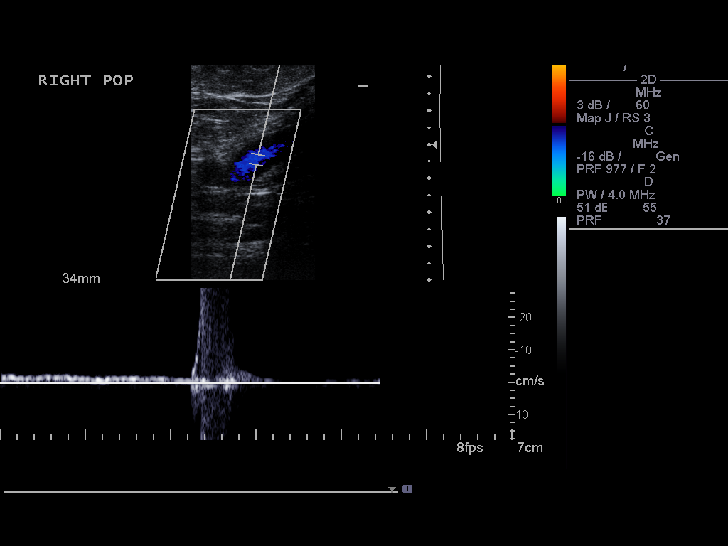
[im 15/29]
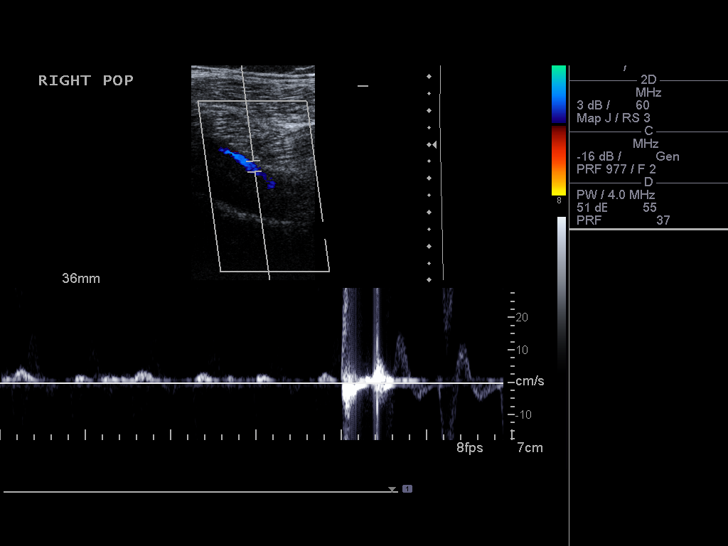
[im 18/29]
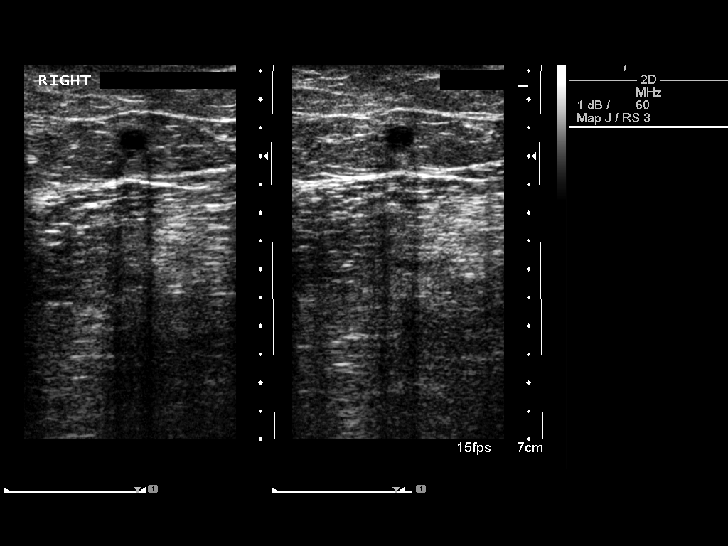
[im 20/29]
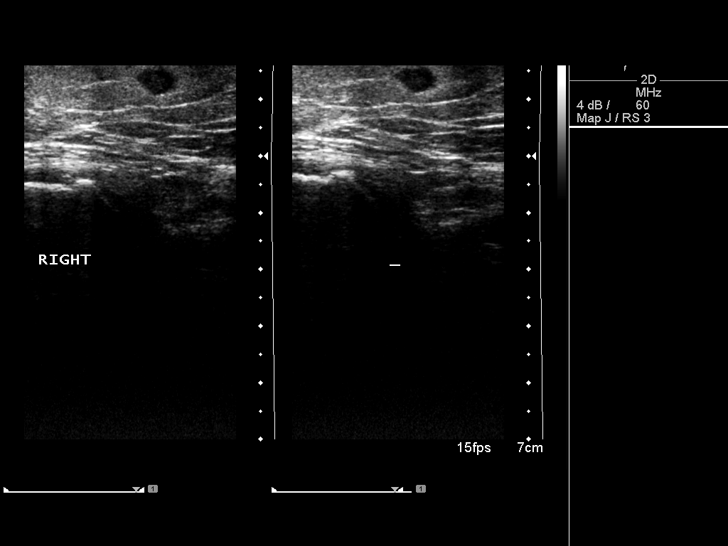
[im 22/29]
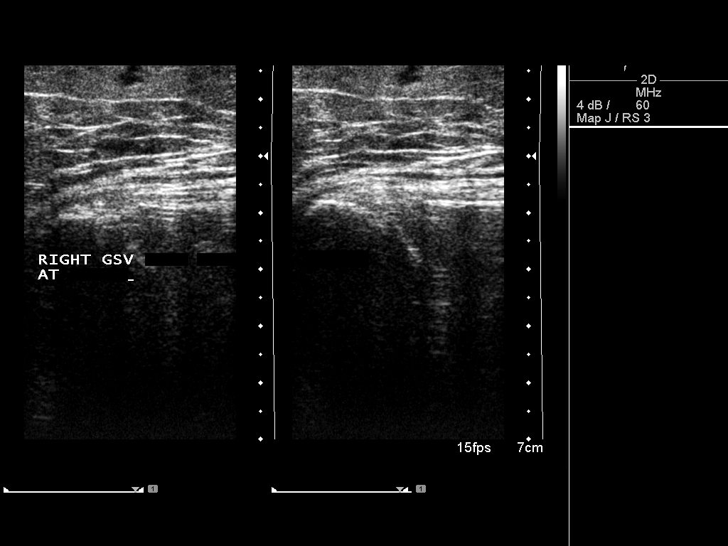
[im 24/29]
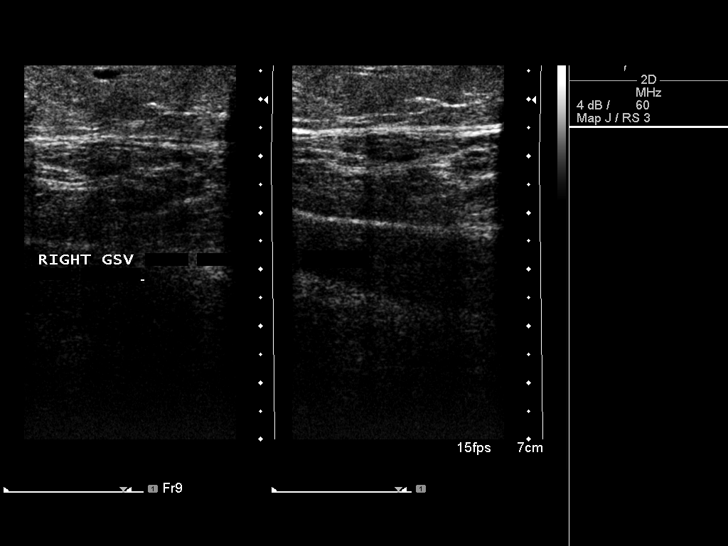
[im 26/29]
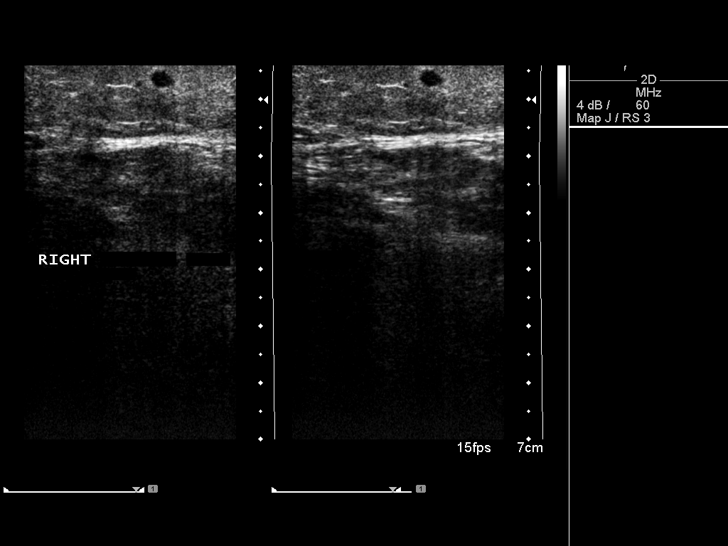
[im 29/29]
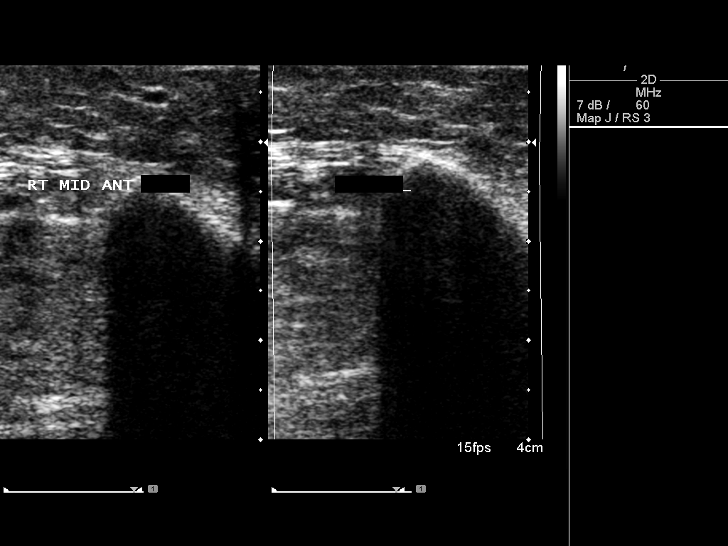

[14 of 24 positions shown; findings below may reference images not displayed]

FINDINGS: Right Lower Extremity:  Right G S V treated site remains occluded.
Medial calf varicosities which were injected are also occluded.  No
recanalization of the treated G S V segment.  Deep veins remain
patent without evidence of DVT.
IMPRESSION: Right G S V treated segment remains occluded at 1 month.
Negative for DVT

## 2012-12-28 IMAGING — US US EXTREM LOW VENOUS*L*
1 series · 14 of 24 positions shown · non-contrast
Comparison: None.

CLINICAL DATA: 1 week post laser occlusion of the left greater
saphenous wing

LEFT LOWER EXTREMITY VENOUS DUPLEX ULTRASOUND
TECHNIQUE: Gray-scale sonography with graded compression, as well
as color Doppler and duplex ultrasound, were performed to evaluate
the deep venous system of the lower extremity from the level of the
common femoral vein through the popliteal and proximal calf veins.
Spectral Doppler was utilized to evaluate flow at rest and with
distal augmentation maneuvers.

[Series 1: us extrem low venous*left* · 14 of 30 slices shown]
[im 1/30]
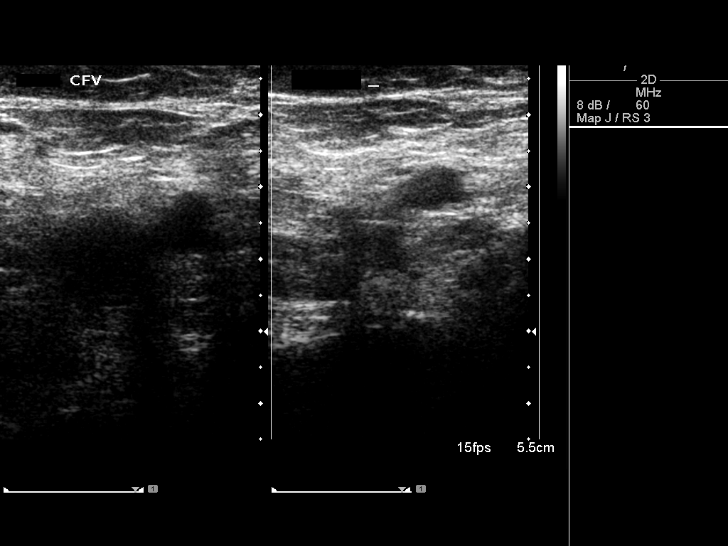
[im 3/30]
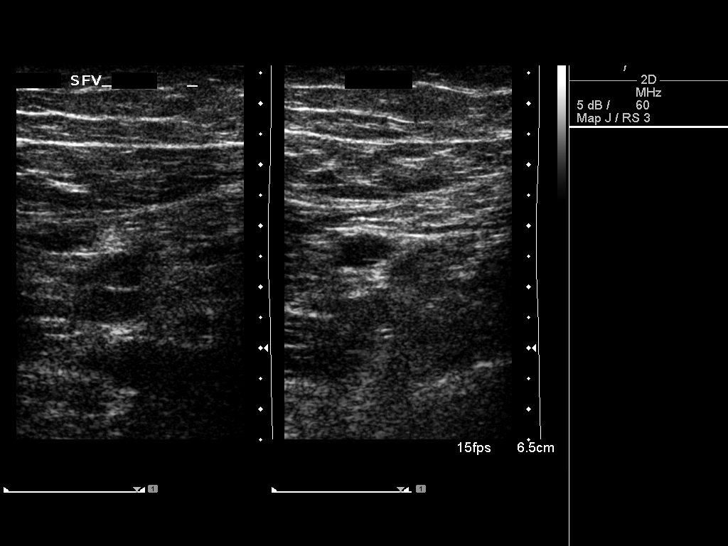
[im 6/30]
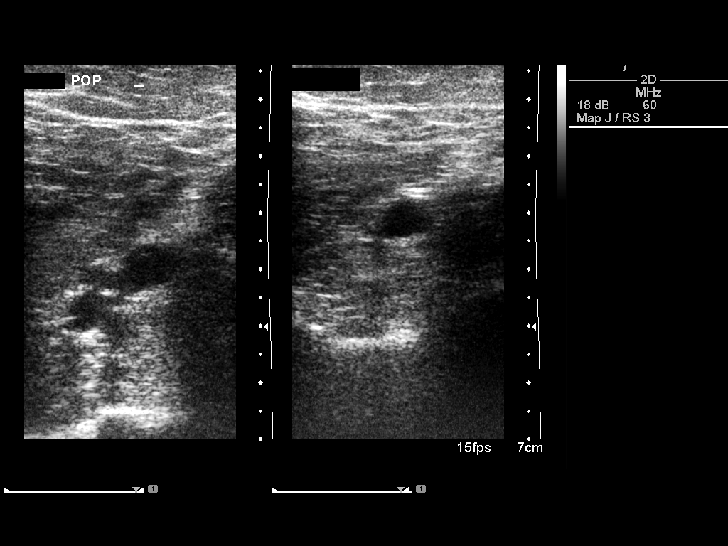
[im 8/30]
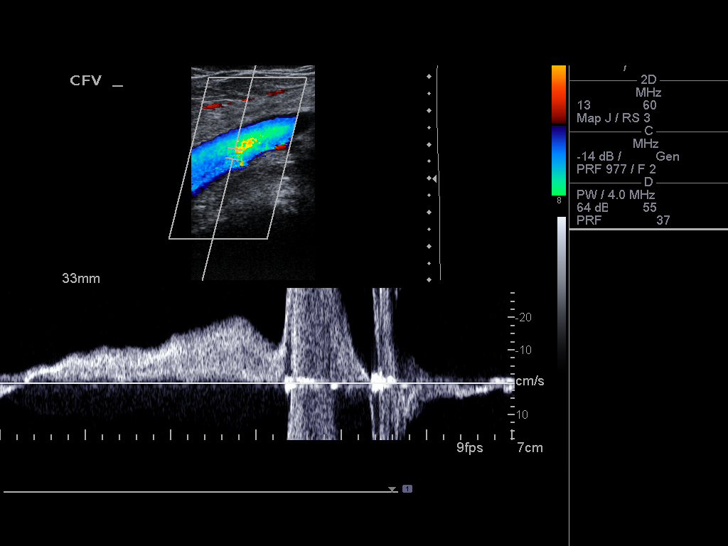
[im 9/30]
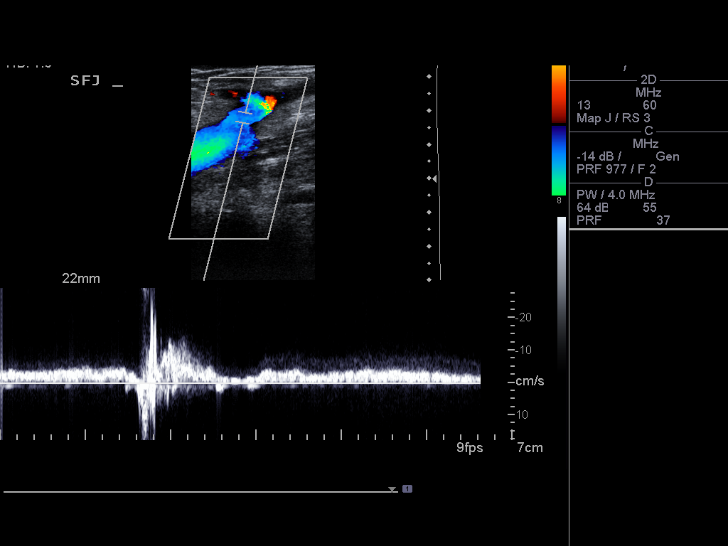
[im 12/30]
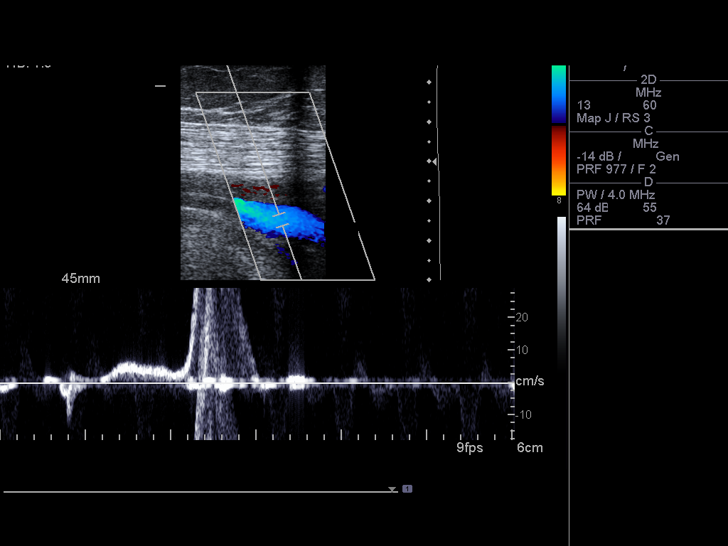
[im 14/30]
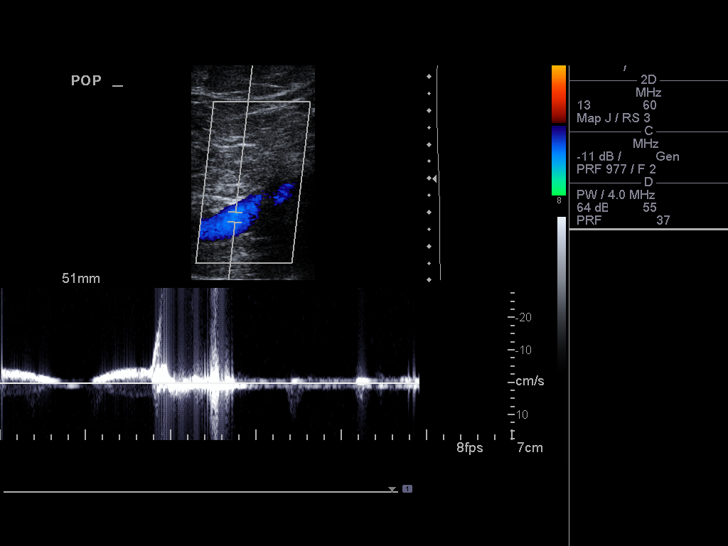
[im 16/30]
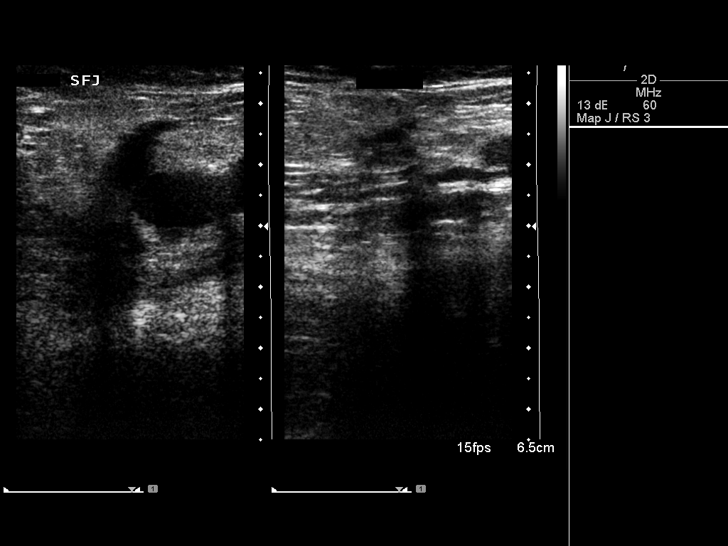
[im 18/30]
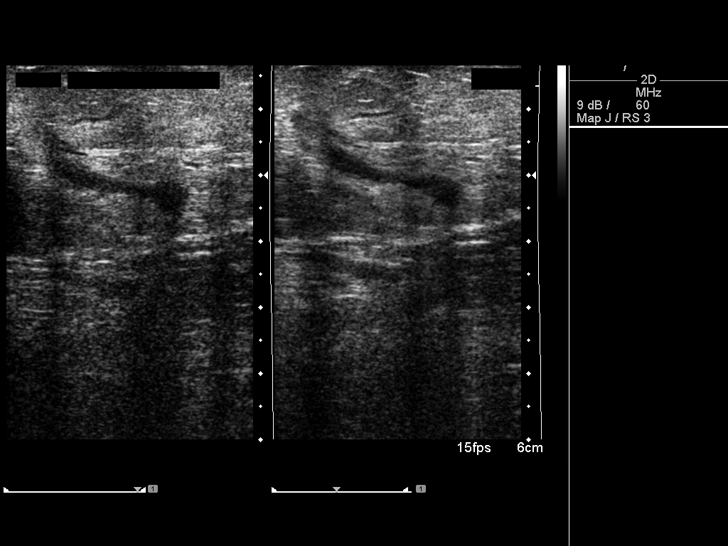
[im 21/30]
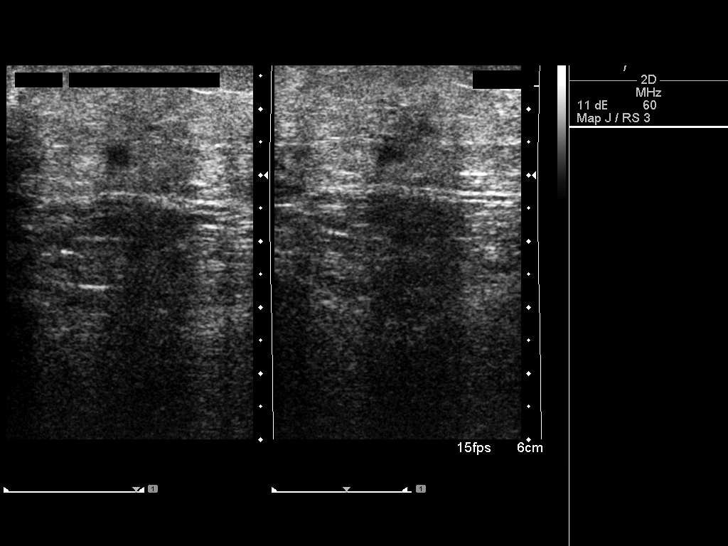
[im 23/30]
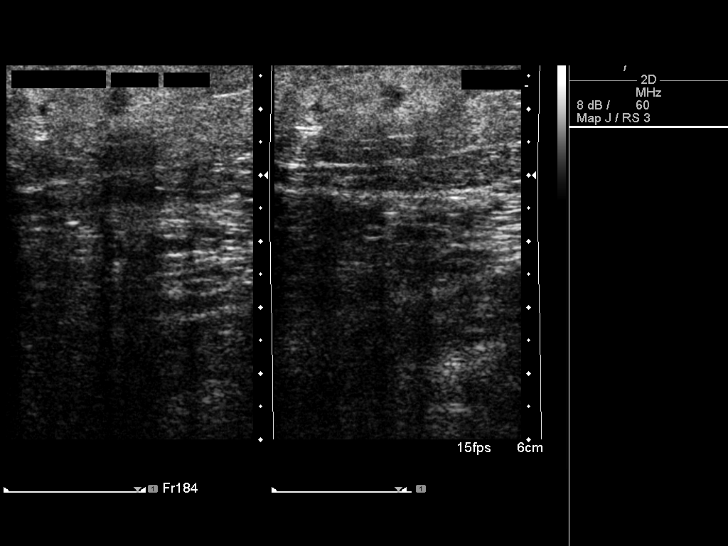
[im 24/30]
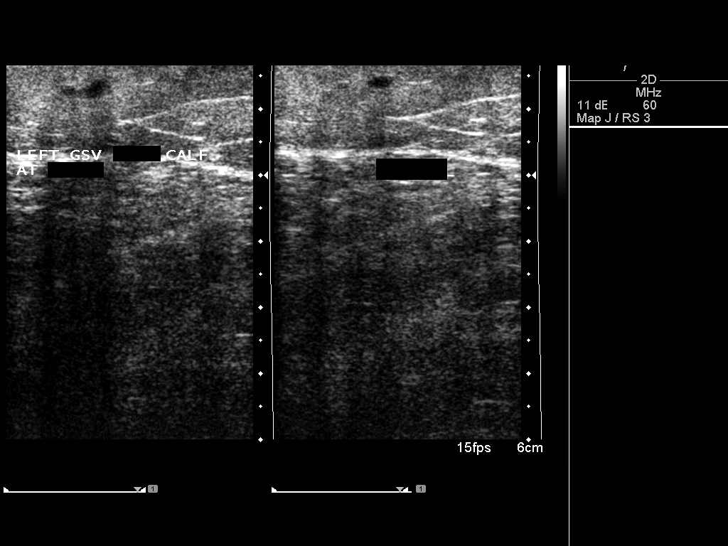
[im 27/30]
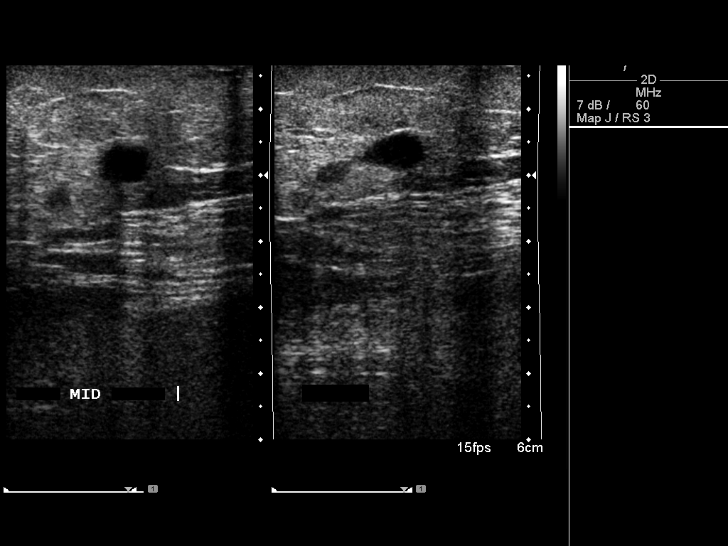
[im 30/30]
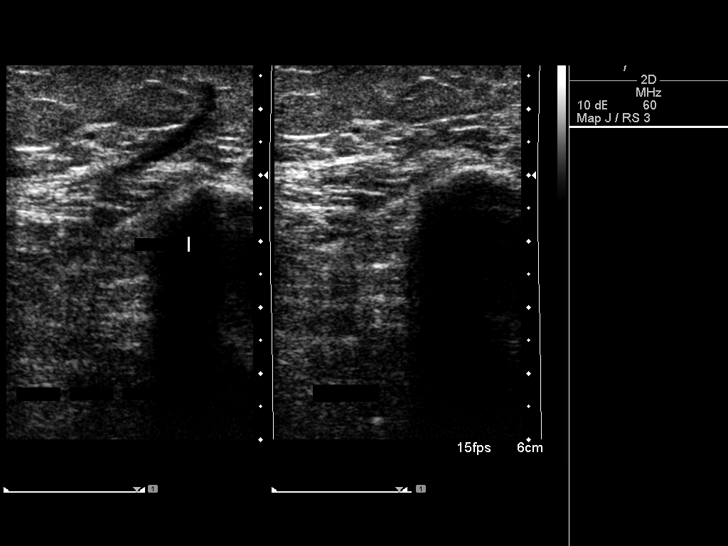

[14 of 24 positions shown; findings below may reference images not displayed]

FINDINGS: There is complete compressibility of the left common
femoral, femoral, and popliteal veins.  Doppler analysis
demonstrates respiratory phasicity and augmentation of flow upon
calf compression.

The left greater saphenous vein is occluded beginning at its
insertion at the saphenofemoral junction and into the mid calf.
Varicosities in the thigh and calf remain patent.
IMPRESSION: No DVT.

Successful greater saphenous vein occlusion.

Varicosities remain patent.

## 2013-04-09 ENCOUNTER — Other Ambulatory Visit: Payer: Self-pay

## 2013-04-09 DIAGNOSIS — Z1231 Encounter for screening mammogram for malignant neoplasm of breast: Secondary | ICD-10-CM

## 2013-04-30 ENCOUNTER — Ambulatory Visit: Payer: PRIVATE HEALTH INSURANCE

## 2013-05-07 ENCOUNTER — Ambulatory Visit
Admission: RE | Admit: 2013-05-07 | Discharge: 2013-05-07 | Disposition: A | Discharge status: Home / Self Care | Payer: PRIVATE HEALTH INSURANCE | Source: Ambulatory Visit

## 2013-05-07 DIAGNOSIS — Z1231 Encounter for screening mammogram for malignant neoplasm of breast: Secondary | ICD-10-CM

## 2014-02-04 IMAGING — US US ABDOMEN COMPLETE
1 series · 13 of 25 positions shown · non-contrast
Comparison: CT 02/17/2007

CLINICAL DATA: Abdominal pain for several hours.

COMPLETE ABDOMINAL ULTRASOUND

[Series 1: us abdomen complete · 0.34mm/px · 13 of 79 slices shown]
[im 1/79]
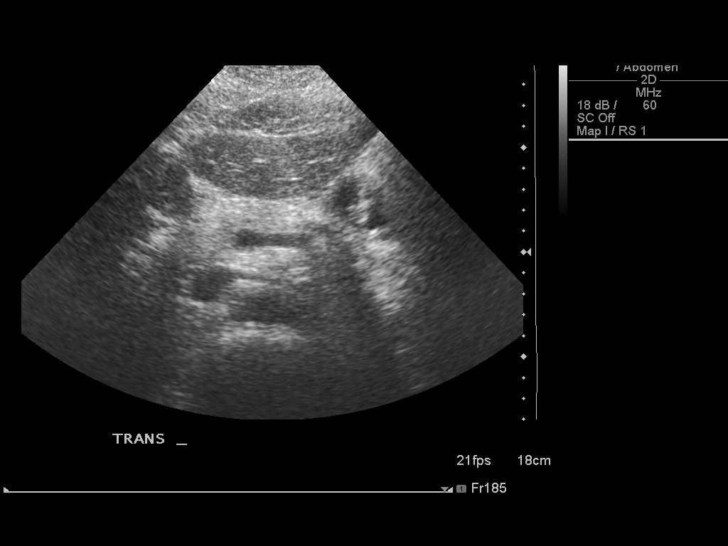
[im 7/79]
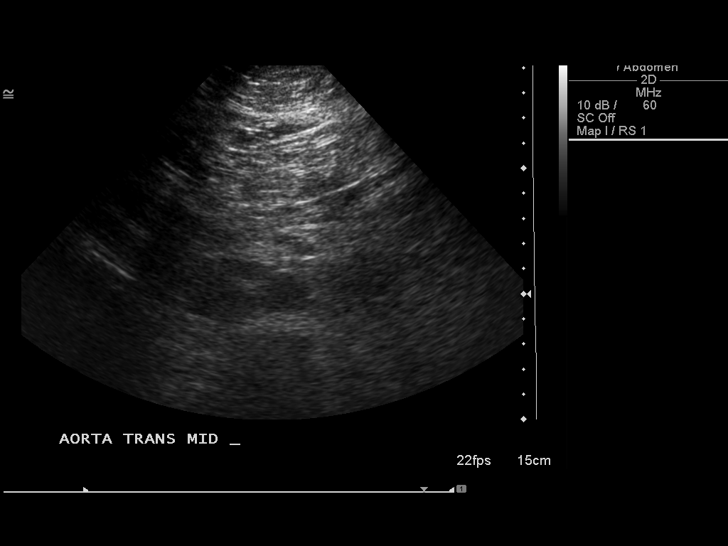
[im 14/79]
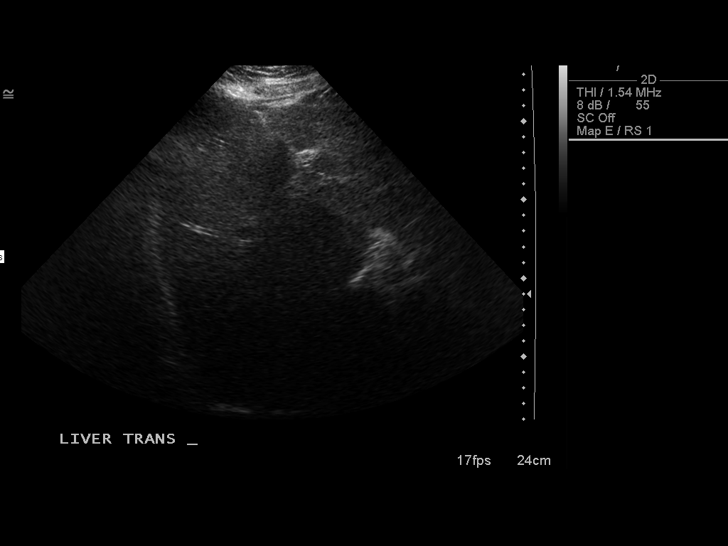
[im 20/79]
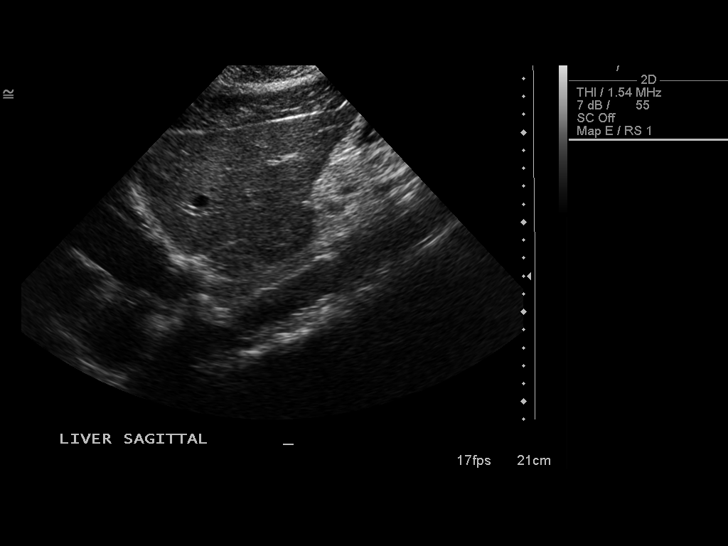
[im 27/79]
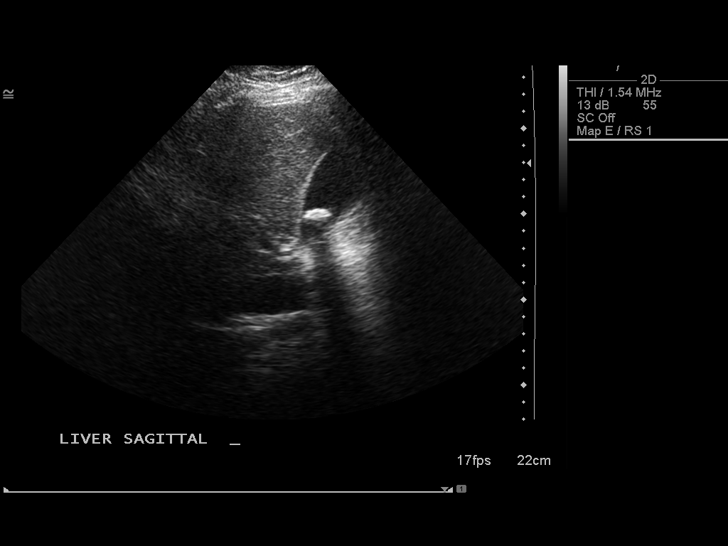
[im 33/79]
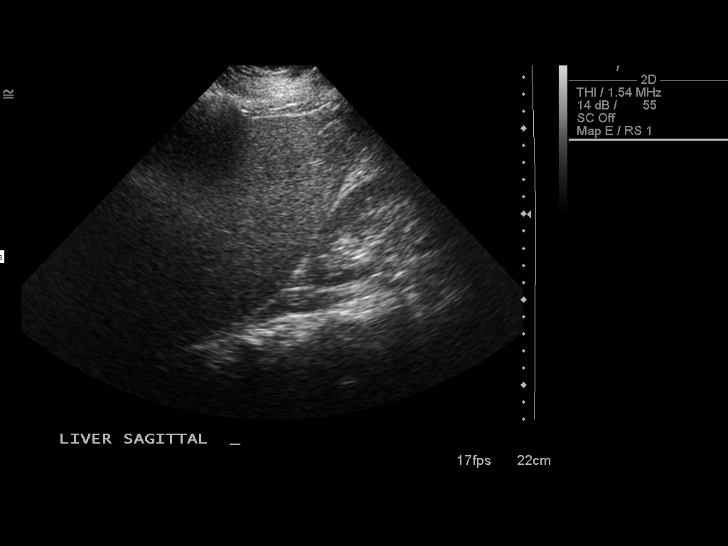
[im 40/79]
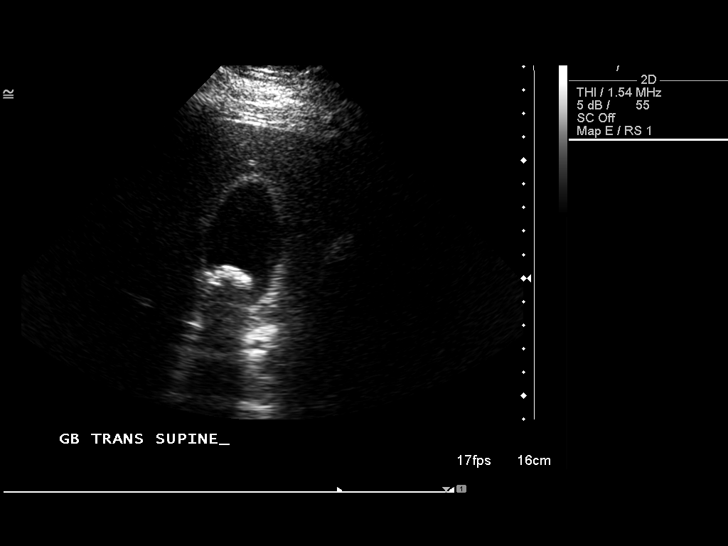
[im 46/79]
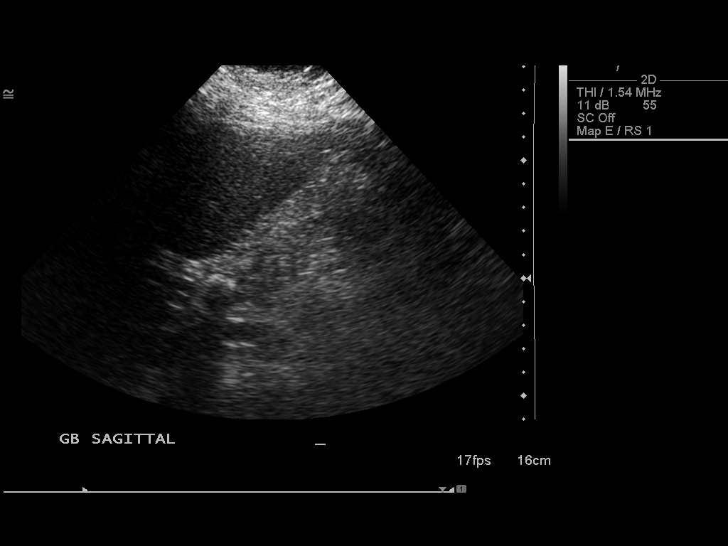
[im 53/79]
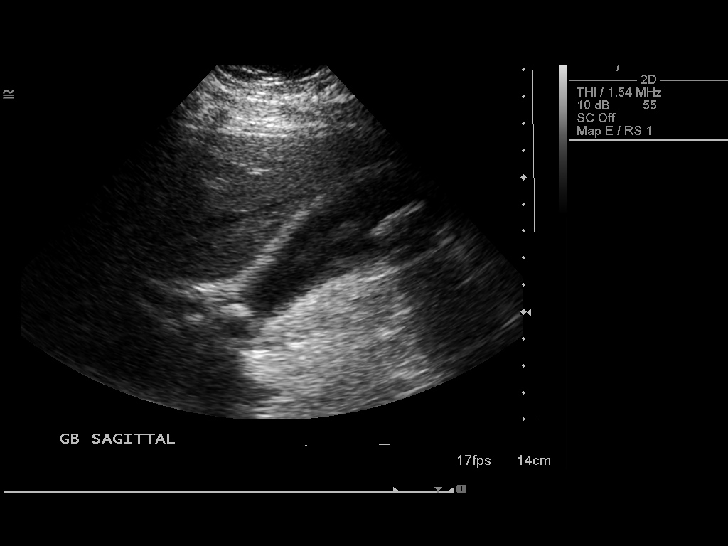
[im 59/79]
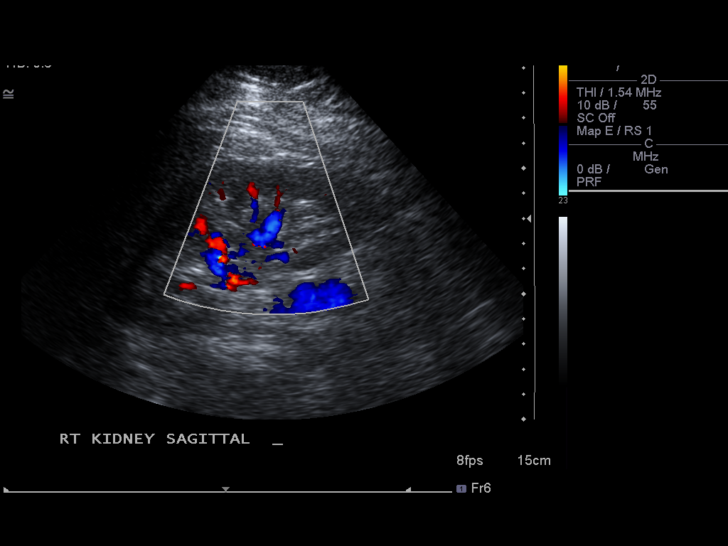
[im 66/79]
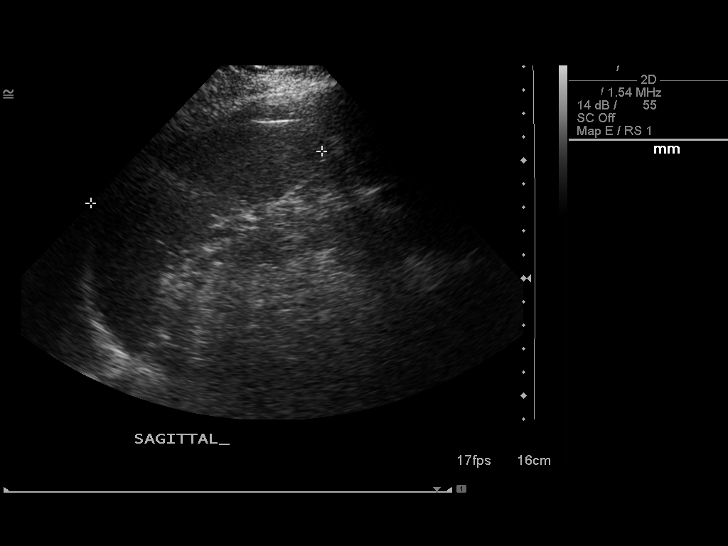
[im 72/79]
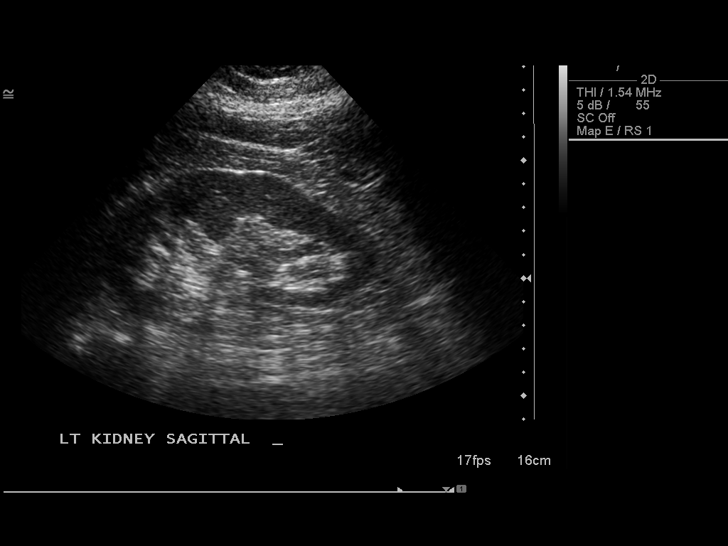
[im 79/79]
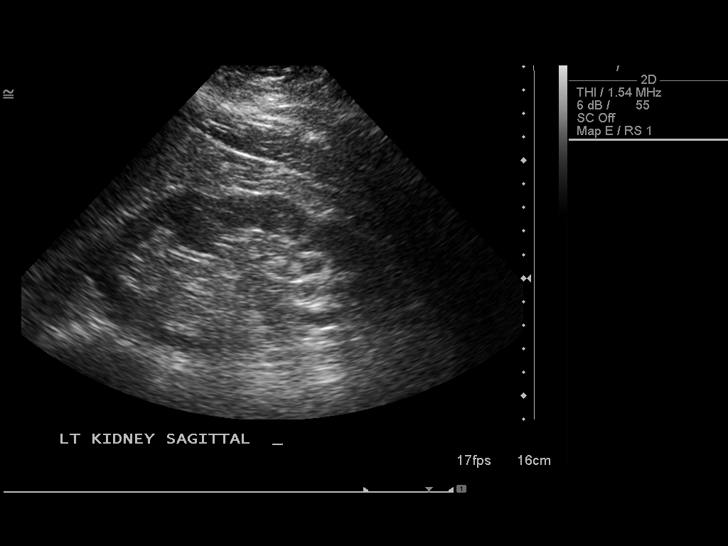

[13 of 25 positions shown; findings below may reference images not displayed]

FINDINGS: Gallbladder:  Multiple stones in the dependent portion of the
gallbladder.  One stone appears to be fixed in the gallbladder
neck.  No significant gallbladder distension, wall thickening, or
pericholecystic edema.  Murphy's sign is positive.  Changes are
nonspecific but could be associated with acute cholecystitis in the
appropriate clinical setting.

Common bile duct:  Normal caliber, measured at 4 mm diameter.

Liver:  Diffusely increased hepatic parenchymal echotexture
suggesting fatty infiltration.  No focal lesions demonstrated.

IVC:  Appears normal.

Pancreas:  The pancreas is mostly obscured by overlying bowel gas.
Visualized portions of the head and tail are unremarkable.

Spleen:  Spleen length measures 10 cm.  Normal homogeneous
parenchymal echotexture.

Right Kidney:  Right kidney measures 11.6 cm length.  No
hydronephrosis.

Left Kidney:  Left kidney measures 12 cm length.  No
hydronephrosis.

Abdominal aorta:  Normal caliber.  No aneurysm.
IMPRESSION: Cholelithiasis with stone in the gallbladder neck.  No wall
thickening or pericholecystic edema, but Murphy's sign is positive.
Changes are nonspecific but could be consistent with acute
cholecystitis in the appropriate clinical setting.  Diffuse fatty
infiltration of the liver.

## 2014-08-29 ENCOUNTER — Other Ambulatory Visit: Payer: Self-pay | Admitting: *Deleted

## 2014-08-29 ENCOUNTER — Encounter: Payer: Self-pay | Admitting: *Deleted

## 2014-08-29 ENCOUNTER — Encounter (INDEPENDENT_AMBULATORY_CARE_PROVIDER_SITE_OTHER): Payer: PRIVATE HEALTH INSURANCE

## 2014-08-29 DIAGNOSIS — R002 Palpitations: Secondary | ICD-10-CM

## 2014-08-29 NOTE — Progress Notes (Signed)
Patient ID: Sydney Woodward, female   DOB: March 21, 1971, 43 y.o.   MRN: [id number] [employer] 48 hour holter monitor applied to patient.

## 2014-08-29 NOTE — Progress Notes (Signed)
48 hour event monitor ordered at the request of [facility]Karen [facility] Phone [phone] fax [phone]
